# Patient Record
Sex: Male | Born: 1985
Health system: Southern US, Community
[De-identification: ages and names within clinical notes are randomized; demographics above are authoritative.]

## PROBLEM LIST (undated history)

## (undated) DIAGNOSIS — B019 Varicella without complication: Secondary | ICD-10-CM

## (undated) DIAGNOSIS — T7840XA Allergy, unspecified, initial encounter: Secondary | ICD-10-CM

## (undated) DIAGNOSIS — L301 Dyshidrosis [pompholyx]: Secondary | ICD-10-CM

## (undated) DIAGNOSIS — M549 Dorsalgia, unspecified: Secondary | ICD-10-CM

## (undated) DIAGNOSIS — F419 Anxiety disorder, unspecified: Secondary | ICD-10-CM

## (undated) DIAGNOSIS — E0590 Thyrotoxicosis, unspecified without thyrotoxic crisis or storm: Secondary | ICD-10-CM

## (undated) HISTORY — DX: Dyshidrosis (pompholyx): L30.1

## (undated) HISTORY — DX: Allergy, unspecified, initial encounter: T78.40XA

## (undated) HISTORY — DX: Thyrotoxicosis, unspecified without thyrotoxic crisis or storm: E05.90

## (undated) HISTORY — DX: Varicella without complication: B01.9

## (undated) HISTORY — PX: NO PAST SURGERIES: SHX2092

## (undated) HISTORY — DX: Anxiety disorder, unspecified: F41.9

## (undated) HISTORY — DX: Dorsalgia, unspecified: M54.9

---
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
## 2005-01-01 ENCOUNTER — Emergency Department: Payer: Self-pay | Admitting: Emergency Medicine

## 2005-04-13 ENCOUNTER — Emergency Department: Payer: Self-pay | Admitting: Emergency Medicine

## 2005-05-14 ENCOUNTER — Emergency Department: Payer: Self-pay | Admitting: Emergency Medicine

## 2005-08-17 ENCOUNTER — Emergency Department: Payer: Self-pay | Admitting: Emergency Medicine

## 2005-10-10 ENCOUNTER — Emergency Department: Payer: Self-pay | Admitting: Emergency Medicine

## 2005-10-11 ENCOUNTER — Emergency Department: Payer: Self-pay | Admitting: Emergency Medicine

## 2006-03-07 ENCOUNTER — Emergency Department: Payer: Self-pay | Admitting: Emergency Medicine

## 2007-07-05 ENCOUNTER — Emergency Department: Payer: Self-pay | Admitting: Internal Medicine

## 2007-07-05 ENCOUNTER — Other Ambulatory Visit: Payer: Self-pay

## 2007-08-30 ENCOUNTER — Emergency Department: Payer: Self-pay | Admitting: Emergency Medicine

## 2008-03-23 ENCOUNTER — Emergency Department: Payer: Self-pay | Admitting: Emergency Medicine

## 2008-04-02 ENCOUNTER — Emergency Department: Payer: Self-pay | Admitting: Emergency Medicine

## 2008-08-18 ENCOUNTER — Emergency Department: Payer: Self-pay | Admitting: Internal Medicine

## 2008-08-26 ENCOUNTER — Emergency Department: Payer: Self-pay | Admitting: Emergency Medicine

## 2009-01-04 ENCOUNTER — Emergency Department: Payer: Self-pay | Admitting: Emergency Medicine

## 2009-01-29 ENCOUNTER — Emergency Department: Payer: Self-pay | Admitting: Emergency Medicine

## 2009-08-02 ENCOUNTER — Emergency Department: Payer: Self-pay | Admitting: Emergency Medicine

## 2010-09-09 ENCOUNTER — Emergency Department: Payer: Self-pay | Admitting: Emergency Medicine

## 2013-02-24 ENCOUNTER — Emergency Department: Payer: Self-pay | Admitting: Emergency Medicine

## 2014-06-09 ENCOUNTER — Emergency Department: Payer: Self-pay | Admitting: Emergency Medicine

## 2014-06-11 LAB — BETA STREP CULTURE(ARMC)

## 2014-06-12 ENCOUNTER — Emergency Department: Payer: Self-pay | Admitting: Emergency Medicine

## 2014-06-14 LAB — BETA STREP CULTURE(ARMC)

## 2014-06-26 ENCOUNTER — Emergency Department: Payer: Self-pay | Admitting: Emergency Medicine

## 2014-06-26 LAB — URINALYSIS, COMPLETE
BACTERIA: NONE SEEN
Bilirubin,UR: NEGATIVE
Blood: NEGATIVE
Glucose,UR: NEGATIVE mg/dL (ref 0–75)
Ketone: NEGATIVE
LEUKOCYTE ESTERASE: NEGATIVE
Nitrite: NEGATIVE
Ph: 6 (ref 4.5–8.0)
Protein: NEGATIVE
Specific Gravity: 1.005 (ref 1.003–1.030)
WBC UR: NONE SEEN /HPF (ref 0–5)

## 2014-06-26 LAB — COMPREHENSIVE METABOLIC PANEL
ANION GAP: 5 — AB (ref 7–16)
AST: 27 U/L (ref 15–37)
Albumin: 4.1 g/dL (ref 3.4–5.0)
Alkaline Phosphatase: 71 U/L
BUN: 9 mg/dL (ref 7–18)
Bilirubin,Total: 0.6 mg/dL (ref 0.2–1.0)
CHLORIDE: 102 mmol/L (ref 98–107)
CO2: 27 mmol/L (ref 21–32)
Calcium, Total: 8.9 mg/dL (ref 8.5–10.1)
Creatinine: 1 mg/dL (ref 0.60–1.30)
EGFR (African American): >60
EGFR (Non-African Amer.): >60
Glucose: 93 mg/dL (ref 65–99)
Osmolality: 267 (ref 275–301)
Potassium: 4.6 mmol/L (ref 3.5–5.1)
SGPT (ALT): 23 U/L (ref 12–78)
Sodium: 134 mmol/L — ABNORMAL LOW (ref 136–145)
Total Protein: 8.3 g/dL — ABNORMAL HIGH (ref 6.4–8.2)

## 2014-06-26 LAB — CBC
HCT: 41.4 % (ref 40.0–52.0)
HGB: 14.4 g/dL (ref 13.0–18.0)
MCH: 34.1 pg — ABNORMAL HIGH (ref 26.0–34.0)
MCHC: 34.9 g/dL (ref 32.0–36.0)
MCV: 98 fL (ref 80–100)
PLATELETS: 163 10*3/uL (ref 150–440)
RBC: 4.23 10*6/uL — ABNORMAL LOW (ref 4.40–5.90)
RDW: 12.5 % (ref 11.5–14.5)
WBC: 11 10*3/uL — ABNORMAL HIGH (ref 3.8–10.6)

## 2014-11-24 ENCOUNTER — Emergency Department: Payer: Self-pay | Admitting: Internal Medicine

## 2014-11-24 LAB — URINALYSIS, COMPLETE
Bacteria: NONE SEEN
Bilirubin,UR: NEGATIVE
Blood: NEGATIVE
Glucose,UR: NEGATIVE mg/dL (ref 0–75)
Leukocyte Esterase: NEGATIVE
Nitrite: NEGATIVE
Ph: 5 (ref 4.5–8.0)
Protein: 30
RBC,UR: <1 /HPF (ref 0–5)
Specific Gravity: 1.032 (ref 1.003–1.030)
Squamous Epithelial: 1
WBC UR: 1 /HPF (ref 0–5)

## 2014-11-24 LAB — CBC WITH DIFFERENTIAL/PLATELET
BASOS PCT: 1.5 %
Basophil #: 0.1 10*3/uL (ref 0.0–0.1)
EOS ABS: 0.1 10*3/uL (ref 0.0–0.7)
EOS PCT: 0.8 %
HCT: 45.8 % (ref 40.0–52.0)
HGB: 15.6 g/dL (ref 13.0–18.0)
Lymphocyte #: 0.5 10*3/uL — ABNORMAL LOW (ref 1.0–3.6)
Lymphocyte %: 7.7 %
MCH: 33.5 pg (ref 26.0–34.0)
MCHC: 34.1 g/dL (ref 32.0–36.0)
MCV: 98 fL (ref 80–100)
MONO ABS: 0.5 x10 3/mm (ref 0.2–1.0)
Monocyte %: 8.4 %
Neutrophil #: 5 10*3/uL (ref 1.4–6.5)
Neutrophil %: 81.6 %
Platelet: 164 10*3/uL (ref 150–440)
RBC: 4.66 10*6/uL (ref 4.40–5.90)
RDW: 12.6 % (ref 11.5–14.5)
WBC: 6.1 10*3/uL (ref 3.8–10.6)

## 2014-11-24 LAB — COMPREHENSIVE METABOLIC PANEL
ALBUMIN: 3.8 g/dL (ref 3.4–5.0)
Alkaline Phosphatase: 79 U/L
Anion Gap: 9 (ref 7–16)
BUN: 15 mg/dL (ref 7–18)
Bilirubin,Total: 0.6 mg/dL (ref 0.2–1.0)
CALCIUM: 8.4 mg/dL — AB (ref 8.5–10.1)
CO2: 25 mmol/L (ref 21–32)
CREATININE: 0.78 mg/dL (ref 0.60–1.30)
Chloride: 104 mmol/L (ref 98–107)
EGFR (African American): >60
EGFR (Non-African Amer.): >60
Glucose: 94 mg/dL (ref 65–99)
Osmolality: 276 (ref 275–301)
POTASSIUM: 3.6 mmol/L (ref 3.5–5.1)
SGOT(AST): 32 U/L (ref 15–37)
SGPT (ALT): 27 U/L
Sodium: 138 mmol/L (ref 136–145)
Total Protein: 7.8 g/dL (ref 6.4–8.2)

## 2015-04-16 ENCOUNTER — Emergency Department
Admission: EM | Admit: 2015-04-16 | Discharge: 2015-04-16 | Disposition: A | Discharge status: Home / Self Care | Payer: BLUE CROSS/BLUE SHIELD | Attending: Emergency Medicine | Admitting: Emergency Medicine

## 2015-04-16 DIAGNOSIS — N342 Other urethritis: Secondary | ICD-10-CM | POA: Insufficient documentation

## 2015-04-16 DIAGNOSIS — R300 Dysuria: Secondary | ICD-10-CM | POA: Diagnosis present

## 2015-04-16 MED ORDER — CEFTRIAXONE SODIUM 250 MG IJ SOLR
250.0000 mg | INTRAMUSCULAR | Status: DC
  Administered 2015-04-16: 250 mg via INTRAMUSCULAR

## 2015-04-16 MED ORDER — CEFTRIAXONE SODIUM 250 MG IJ SOLR
INTRAMUSCULAR | Status: AC
  Administered 2015-04-16: 250 mg via INTRAMUSCULAR
  Filled 2015-04-16: qty 250

## 2015-04-16 MED ORDER — AZITHROMYCIN 250 MG PO TABS
ORAL_TABLET | ORAL | Status: AC
  Administered 2015-04-16: 1000 mg via ORAL
  Filled 2015-04-16: qty 4

## 2015-04-16 MED ORDER — AZITHROMYCIN 250 MG PO TABS
1000.0000 mg | ORAL_TABLET | Freq: Once | ORAL | Status: AC
  Administered 2015-04-16: 1000 mg via ORAL

## 2015-04-16 NOTE — Discharge Instructions (Signed)
Urethritis °Urethritis is an inflammation of the tube through which urine exits your bladder (urethra).  °CAUSES °Urethritis is often caused by an infection in your urethra. The infection can be viral, like herpes. The infection can also be bacterial, like gonorrhea. °RISK FACTORS °Risk factors of urethritis include: °· Having sex without using a condom. °· Having multiple sexual partners. °· Having poor hygiene. °SIGNS AND SYMPTOMS °Symptoms of urethritis are less noticeable in women than in men. These symptoms include: °· Burning feeling when you urinate (dysuria). °· Discharge from your urethra. °· Blood in your urine (hematuria). °· Urinating more than usual. °DIAGNOSIS  °To confirm a diagnosis of urethritis, your health care provider will do the following: °· Ask about your sexual history. °· Perform a physical exam. °· Have you provide a sample of your urine for lab testing. °· Use a cotton swab to gently collect a sample from your urethra for lab testing. °TREATMENT  °It is important to treat urethritis. Depending on the cause, untreated urethritis may lead to serious genital infections and possibly infertility. Urethritis caused by a bacterial infection is treated with antibiotic medicine. All sexual partners must be treated.  °HOME CARE INSTRUCTIONS °· Do not have sex until the test results are known and treatment is completed, even if your symptoms go away before you finish treatment. °· If you were prescribed an antibiotic, finish it all even if you start to feel better. °SEEK MEDICAL CARE IF:  °· Your symptoms are not improved in 3 days. °· Your symptoms are getting worse. °· You develop abdominal pain or pelvic pain (in women). °· You develop joint pain. °· You have a fever. °SEEK IMMEDIATE MEDICAL CARE IF:  °· You have severe pain in the belly, back, or side. °· You have repeated vomiting. °MAKE SURE YOU: °· Understand these instructions. °· Will watch your condition. °· Will get help right away if you  are not doing well or get worse. °Document Released: 05/26/2001 Document Revised: 04/16/2014 Document Reviewed: 07/31/2013 °Exit[employer]® Patient Information ©2015 [employer]. This information is not intended to replace advice given to you by your health care provider. Make sure you discuss any questions you have with your health care provider. ° °

## 2015-04-16 NOTE — ED Notes (Signed)
Pt to ED c/o burning with urination starting last night.  Pt states "off color" discharge as well.  Denies hx of UTI, urgency or frequency.

## 2015-04-16 NOTE — ED Notes (Signed)
Pt states he has been having a burning at the tip of his penis since 0300 this am. Pt also endorses a yellow discharge from his penis. Pt claims he is married and only has sex with his wife.

## 2015-04-16 NOTE — ED Provider Notes (Addendum)
[facility] Emergency Department Provider Note   Time seen: 9:48 PM  I have reviewed the triage vital signs and the nursing notes.   HISTORY  Chief Complaint Dysuria    HPI Phillip Howell is a 29 y.o. male percents for dysuria and burning at his penis and so 300 this a.m. Patient has a yellow discharge from his penis. States she is sexually active last history of same burning is mild again worsening with urination.    No past medical history on file.  There are no active problems to display for this patient.   No past surgical history on file.  No current outpatient prescriptions on file.  Allergies Review of patient's allergies indicates no known allergies.  No family history on file.  Social History History  Substance Use Topics  . Smoking status: Not on file  . Smokeless tobacco: Not on file  . Alcohol Use: Not on file    Review of Systems Constitutional: Negative for fever. Eyes: Negative for visual changes. ENT: Negative for sore throat. Cardiovascular: Negative for chest pain. Respiratory: Negative for shortness of breath. Gastrointestinal: Negative for abdominal pain, vomiting and diarrhea. Genitourinary: Negative for dysuria. Musculoskeletal: Negative for back pain. Skin: Negative for rash. Neurological: Negative for headaches, focal weakness or numbness.  10-point ROS otherwise negative.  ____________________________________________   PHYSICAL EXAM:  VITAL SIGNS: ED Triage Vitals  Enc Vitals Group     BP 04/16/15 2123 114/67 mmHg     Pulse Rate 04/16/15 2123 73     Resp 04/16/15 2123 16     Temp 04/16/15 2123 98.5 F (36.9 C)     Temp Source 04/16/15 2123 Oral     SpO2 04/16/15 2123 99 %     Weight 04/16/15 2123 200 lb (90.719 kg)     Height 04/16/15 2123 5\' 11"  (1.803 m)     Head Cir --      Peak Flow --      Pain Score 04/16/15 2124 0     Pain Loc --      Pain Edu? --      Excl. in GC? --      Constitutional: Alert and oriented. Well appearing and in no distress.    Mouth/Throat: Mucous membranes are moist. Genitourinary: Discharge noted from penis ____________________________________________    LABS (pertinent positives/negatives)  None necessary  ____________________________________________        IMPRESSION / ASSESSMENT AND PLAN / ED COURSE  Pertinent labs & imaging results that were available during my care of the patient were reviewed by me and considered in my medical decision making (see chart for details).  Urethritis  Patient clinically with either gonorrhea or chlamydia. Will present really treat with Rocephin and azithromycin. Patient is advised to let his sexual partners now. Stable for discharge.   Phillip Howell, Phillip Oconnor E, MD   Phillip FilbertJonathan Howell Joyclyn Plazola, MD 04/16/15 [phone]  Phillip FilbertJonathan Howell Emrie Gayle, MD 05/02/15 [phone]

## 2015-04-16 NOTE — ED Notes (Signed)
Pt on med hold. 

## 2015-07-31 ENCOUNTER — Emergency Department: Payer: BLUE CROSS/BLUE SHIELD

## 2015-07-31 ENCOUNTER — Emergency Department
Admission: EM | Admit: 2015-07-31 | Discharge: 2015-07-31 | Disposition: A | Discharge status: Home / Self Care | Payer: BLUE CROSS/BLUE SHIELD | Attending: Emergency Medicine | Admitting: Emergency Medicine

## 2015-07-31 ENCOUNTER — Encounter: Payer: Self-pay | Admitting: Urgent Care

## 2015-07-31 DIAGNOSIS — S199XXA Unspecified injury of neck, initial encounter: Secondary | ICD-10-CM | POA: Diagnosis present

## 2015-07-31 DIAGNOSIS — Y92410 Unspecified street and highway as the place of occurrence of the external cause: Secondary | ICD-10-CM | POA: Diagnosis not present

## 2015-07-31 DIAGNOSIS — Y9389 Activity, other specified: Secondary | ICD-10-CM | POA: Insufficient documentation

## 2015-07-31 DIAGNOSIS — M542 Cervicalgia: Secondary | ICD-10-CM

## 2015-07-31 DIAGNOSIS — M7918 Myalgia, other site: Secondary | ICD-10-CM

## 2015-07-31 DIAGNOSIS — Y998 Other external cause status: Secondary | ICD-10-CM | POA: Diagnosis not present

## 2015-07-31 MED ORDER — CYCLOBENZAPRINE HCL 10 MG PO TABS
5.0000 mg | ORAL_TABLET | Freq: Once | ORAL | Status: AC
  Administered 2015-07-31: 5 mg via ORAL
  Filled 2015-07-31: qty 1

## 2015-07-31 MED ORDER — TRAMADOL HCL 50 MG PO TABS: 50.0000 mg | ORAL_TABLET | Freq: Four times a day (QID) | ORAL | Status: DC | PRN

## 2015-07-31 MED ORDER — DIAZEPAM 2 MG PO TABS: 2.0000 mg | ORAL_TABLET | Freq: Three times a day (TID) | ORAL | Status: DC | PRN
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
## 2015-07-31 NOTE — Discharge Instructions (Signed)
Cervical Sprain °A cervical sprain is an injury in the neck in which the strong, fibrous tissues (ligaments) that connect your neck bones stretch or tear. Cervical sprains can range from mild to severe. Severe cervical sprains can cause the neck vertebrae to be unstable. This can lead to damage of the spinal cord and can result in serious nervous system problems. The amount of time it takes for a cervical sprain to get better depends on the cause and extent of the injury. Most cervical sprains heal in 1 to 3 weeks. °CAUSES  °Severe cervical sprains may be caused by:  °· Contact sport injuries (such as from football, rugby, wrestling, hockey, auto racing, gymnastics, diving, martial arts, or boxing).   °· Motor vehicle collisions.   °· Whiplash injuries. This is an injury from a sudden forward and backward whipping movement of the head and neck.  °· Falls.   °Mild cervical sprains may be caused by:  °· Being in an awkward position, such as while cradling a telephone between your ear and shoulder.   °· Sitting in a chair that does not offer proper support.   °· Working at a poorly designed computer station.   °· Looking up or down for long periods of time.   °SYMPTOMS  °· Pain, soreness, stiffness, or a burning sensation in the front, back, or sides of the neck. This discomfort may develop immediately after the injury or slowly, 24 hours or more after the injury.   °· Pain or tenderness directly in the middle of the back of the neck.   °· Shoulder or upper back pain.   °· Limited ability to move the neck.   °· Headache.   °· Dizziness.   °· Weakness, numbness, or tingling in the hands or arms.   °· Muscle spasms.   °· Difficulty swallowing or chewing.   °· Tenderness and swelling of the neck.   °DIAGNOSIS  °Most of the time your health care provider can diagnose a cervical sprain by taking your history and doing a physical exam. Your health care provider will ask about previous neck injuries and any known neck  problems, such as arthritis in the neck. X-rays may be taken to find out if there are any other problems, such as with the bones of the neck. Other tests, such as a CT scan or MRI, may also be needed.  °TREATMENT  °Treatment depends on the severity of the cervical sprain. Mild sprains can be treated with rest, keeping the neck in place (immobilization), and pain medicines. Severe cervical sprains are immediately immobilized. Further treatment is done to help with pain, muscle spasms, and other symptoms and may include: °· Medicines, such as pain relievers, numbing medicines, or muscle relaxants.   °· Physical therapy. This may involve stretching exercises, strengthening exercises, and posture training. Exercises and improved posture can help stabilize the neck, strengthen muscles, and help stop symptoms from returning.   °HOME CARE INSTRUCTIONS  °· Put ice on the injured area.   °¨ Put ice in a plastic bag.   °¨ Place a towel between your skin and the bag.   °¨ Leave the ice on for 15-20 minutes, 3-4 times a day.   °· If your injury was severe, you may have been given a cervical collar to wear. A cervical collar is a two-piece collar designed to keep your neck from moving while it heals. °¨ Do not remove the collar unless instructed by your health care provider. °¨ If you have long hair, keep it outside of the collar. °¨ Ask your health care provider before making any adjustments to your collar. Minor   adjustments may be required over time to improve comfort and reduce pressure on your chin or on the back of your head. °¨ If you are allowed to remove the collar for cleaning or bathing, follow your health care provider's instructions on how to do so safely. °¨ Keep your collar clean by wiping it with mild soap and water and drying it completely. If the collar you have been given includes removable pads, remove them every 1-2 days and hand wash them with soap and water. Allow them to air dry. They should be completely  dry before you wear them in the collar. °¨ If you are allowed to remove the collar for cleaning and bathing, wash and dry the skin of your neck. Check your skin for irritation or sores. If you see any, tell your health care provider. °¨ Do not drive while wearing the collar.   °· Only take over-the-counter or prescription medicines for pain, discomfort, or fever as directed by your health care provider.   °· Keep all follow-up appointments as directed by your health care provider.   °· Keep all physical therapy appointments as directed by your health care provider.   °· Make any needed adjustments to your workstation to promote good posture.   °· Avoid positions and activities that make your symptoms worse.   °· Warm up and stretch before being active to help prevent problems.   °SEEK MEDICAL CARE IF:  °· Your pain is not controlled with medicine.   °· You are unable to decrease your pain medicine over time as planned.   °· Your activity level is not improving as expected.   °SEEK IMMEDIATE MEDICAL CARE IF:  °· You develop any bleeding. °· You develop stomach upset. °· You have signs of an allergic reaction to your medicine.   °· Your symptoms get worse.   °· You develop new, unexplained symptoms.   °· You have numbness, tingling, weakness, or paralysis in any part of your body.   °MAKE SURE YOU:  °· Understand these instructions. °· Will watch your condition. °· Will get help right away if you are not doing well or get worse. °Document Released: 09/27/2007 Document Revised: 12/05/2013 Document Reviewed: 06/07/2013 °[employer]® Patient Information ©2015 [employer]. This information is not intended to replace advice given to you by your health care provider. Make sure you discuss any questions you have with your health care provider. °Musculoskeletal Pain °Musculoskeletal pain is muscle and boney aches and pains. These pains can occur in any part of the body. Your caregiver may treat you without knowing the cause of  the pain. They may treat you if blood or urine tests, X-rays, and other tests were normal.  °CAUSES °There is often not a definite cause or reason for these pains. These pains may be caused by a type of germ (virus). The discomfort may also come from overuse. Overuse includes working out too hard when your body is not fit. Boney aches also come from weather changes. Bone is sensitive to atmospheric pressure changes. °HOME CARE INSTRUCTIONS  °· Ask when your test results will be ready. Make sure you get your test results. °· Only take over-the-counter or prescription medicines for pain, discomfort, or fever as directed by your caregiver. If you were given medications for your condition, do not drive, operate machinery or power tools, or sign legal documents for 24 hours. Do not drink alcohol. Do not take sleeping pills or other medications that may interfere with treatment. °· Continue all activities unless the activities cause more pain. When the pain lessens, slowly resume   normal activities. Gradually increase the intensity and duration of the activities or exercise. °· During periods of severe pain, bed rest may be helpful. Lay or sit in any position that is comfortable. °· Putting ice on the injured area. °¨ Put ice in a bag. °¨ Place a towel between your skin and the bag. °¨ Leave the ice on for 15 to 20 minutes, 3 to 4 times a day. °· Follow up with your caregiver for continued problems and no reason can be found for the pain. If the pain becomes worse or does not go away, it may be necessary to repeat tests or do additional testing. Your caregiver may need to look further for a possible cause. °SEEK IMMEDIATE MEDICAL CARE IF: °· You have pain that is getting worse and is not relieved by medications. °· You develop chest pain that is associated with shortness or breath, sweating, feeling sick to your stomach (nauseous), or throw up (vomit). °· Your pain becomes localized to the abdomen. °· You develop any new  symptoms that seem different or that concern you. °MAKE SURE YOU:  °· Understand these instructions. °· Will watch your condition. °· Will get help right away if you are not doing well or get worse. °Document Released: 11/30/2005 Document Revised: 02/22/2012 Document Reviewed: 08/04/2013 °[employer]® Patient Information ©2015 [employer]. This information is not intended to replace advice given to you by your health care provider. Make sure you discuss any questions you have with your health care provider. ° °

## 2015-07-31 NOTE — ED Notes (Signed)
Webster,MD consulted. MD made aware of presenting complaints and triage assessment. MD with [facility] for plain films of patient's cervical spine. Orders to be entered and carried by this RN.

## 2015-09-10 ENCOUNTER — Encounter: Payer: Self-pay | Admitting: Emergency Medicine

## 2015-09-10 ENCOUNTER — Emergency Department
Admission: EM | Admit: 2015-09-10 | Discharge: 2015-09-10 | Disposition: A | Discharge status: Home / Self Care | Payer: BLUE CROSS/BLUE SHIELD | Attending: Emergency Medicine | Admitting: Emergency Medicine

## 2015-09-10 ENCOUNTER — Emergency Department: Payer: BLUE CROSS/BLUE SHIELD

## 2015-09-10 DIAGNOSIS — S40811A Abrasion of right upper arm, initial encounter: Secondary | ICD-10-CM | POA: Diagnosis not present

## 2015-09-10 DIAGNOSIS — S60811A Abrasion of right wrist, initial encounter: Secondary | ICD-10-CM | POA: Diagnosis not present

## 2015-09-10 DIAGNOSIS — Y92410 Unspecified street and highway as the place of occurrence of the external cause: Secondary | ICD-10-CM | POA: Diagnosis not present

## 2015-09-10 DIAGNOSIS — Y9389 Activity, other specified: Secondary | ICD-10-CM | POA: Insufficient documentation

## 2015-09-10 DIAGNOSIS — S60511A Abrasion of right hand, initial encounter: Secondary | ICD-10-CM | POA: Insufficient documentation

## 2015-09-10 DIAGNOSIS — T07XXXA Unspecified multiple injuries, initial encounter: Secondary | ICD-10-CM

## 2015-09-10 DIAGNOSIS — Y998 Other external cause status: Secondary | ICD-10-CM | POA: Insufficient documentation

## 2015-09-10 DIAGNOSIS — S6991XA Unspecified injury of right wrist, hand and finger(s), initial encounter: Secondary | ICD-10-CM | POA: Diagnosis present

## 2015-09-10 MED ORDER — IBUPROFEN 800 MG PO TABS: 800.0000 mg | ORAL_TABLET | Freq: Three times a day (TID) | ORAL | Status: DC | PRN

## 2015-09-10 MED ORDER — BACITRACIN ZINC 500 UNIT/GM EX OINT: TOPICAL_OINTMENT | CUTANEOUS | Status: AC

## 2015-09-10 MED ORDER — TETANUS-DIPHTH-ACELL PERTUSSIS 5-2.5-18.5 LF-MCG/0.5 IM SUSP
0.5000 mL | Freq: Once | INTRAMUSCULAR | Status: AC
  Administered 2015-09-10: 0.5 mL via INTRAMUSCULAR
  Filled 2015-09-10: qty 0.5

## 2015-09-10 NOTE — Discharge Instructions (Signed)
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
## 2016-03-05 ENCOUNTER — Emergency Department
Admission: EM | Admit: 2016-03-05 | Discharge: 2016-03-06 | Disposition: A | Discharge status: Home / Self Care | Payer: BLUE CROSS/BLUE SHIELD | Attending: Student | Admitting: Student

## 2016-03-05 DIAGNOSIS — Z792 Long term (current) use of antibiotics: Secondary | ICD-10-CM | POA: Diagnosis not present

## 2016-03-05 DIAGNOSIS — H5712 Ocular pain, left eye: Secondary | ICD-10-CM | POA: Diagnosis present

## 2016-03-05 DIAGNOSIS — H109 Unspecified conjunctivitis: Secondary | ICD-10-CM | POA: Insufficient documentation

## 2016-03-05 NOTE — ED Notes (Signed)
Pt in with co left eye redness and itching since this am, no injury.

## 2016-03-06 MED ORDER — ERYTHROMYCIN 5 MG/GM OP OINT: 1.0000 "application " | TOPICAL_OINTMENT | Freq: Four times a day (QID) | OPHTHALMIC | Status: DC

## 2016-03-06 MED ORDER — FLUORESCEIN SODIUM 1 MG OP STRP
1.0000 | ORAL_STRIP | Freq: Once | OPHTHALMIC | Status: AC
  Administered 2016-03-06: 1 via OPHTHALMIC

## 2016-03-06 MED ORDER — FLUORESCEIN SODIUM 1 MG OP STRP
ORAL_STRIP | OPHTHALMIC | Status: AC
  Administered 2016-03-06: 1 via OPHTHALMIC
  Filled 2016-03-06: qty 1

## 2016-03-06 NOTE — ED Provider Notes (Signed)
[facility] Emergency Department Provider Note  ____________________________________________  Time seen: Approximately 12:11 AM  I have reviewed the triage vital signs and the nursing notes.   HISTORY  Chief Complaint Eye Pain    HPI Phillip [place] is a 30 y.o. male with no chronic medical problems who presents for evaluation of left eye itching and redness this afternoon, gradual onset, constant since onset, currently moderate, no modifying factors. Patient reports that he awoke from a nap this afternoon and his left eye was red and itchy. He denies any pain or vision change. He denies any trauma to the eye or possibility of foreign body. He reports that there is some mild drainage. No coughing, sneezing, runny nose, congestion, vomiting, diarrhea, fevers or chills. No chest pain or difficulty breathing.   No past medical history on file.  There are no active problems to display for this patient.   No past surgical history on file.  Current Outpatient Rx  Name  Route  Sig  Dispense  Refill  . bacitracin ointment      Apply to affected area daily   30 g   0   . diazepam (VALIUM) 2 MG tablet   Oral   Take 1 tablet (2 mg total) by mouth every 8 (eight) hours as needed for muscle spasms.   12 tablet   0   . ibuprofen (ADVIL,MOTRIN) 800 MG tablet   Oral   Take 1 tablet (800 mg total) by mouth every 8 (eight) hours as needed.   30 tablet   0   . traMADol (ULTRAM) 50 MG tablet   Oral   Take 1 tablet (50 mg total) by mouth every 6 (six) hours as needed.   12 tablet   0     Allergies Review of patient's allergies indicates no known allergies.  No family history on file.  Social History Social History  Substance Use Topics  . Smoking status: Never Smoker   . Smokeless tobacco: Not on file  . Alcohol Use: No    Review of Systems Constitutional: No fever/chills Eyes: No visual changes. ENT: No sore throat. Cardiovascular: Denies chest  pain. Respiratory: Denies shortness of breath. Gastrointestinal: No abdominal pain.  No nausea, no vomiting.  No diarrhea.  No constipation. Genitourinary: Negative for dysuria. Musculoskeletal: Negative for back pain. Skin: Negative for rash. Neurological: Negative for headaches, focal weakness or numbness.  10-point ROS otherwise negative.  ____________________________________________   PHYSICAL EXAM:  VITAL SIGNS: ED Triage Vitals  Enc Vitals Group     BP 03/05/16 2338 112/68 mmHg     Pulse Rate 03/05/16 2338 59     Resp 03/05/16 2338 18     Temp 03/05/16 2338 98.2 F (36.8 C)     Temp Source 03/05/16 2338 Oral     SpO2 03/05/16 2338 100 %     Weight 03/05/16 2338 185 lb (83.915 kg)     Height 03/05/16 2338 5\' 11"  (1.803 m)     Head Cir --      Peak Flow --      Pain Score 03/05/16 2339 6     Pain Loc --      Pain Edu? --      Excl. in GC? --     Constitutional: Alert and oriented. Well appearing and in no acute distress. Eyes: Very faint conjunctival erythema of the left eye. Normal conjunctiva on the right. No added drainage. PERRL. EOMI. Woods lamp exam with fluorescein stain  reveals no corneal abrasion. Head: Atraumatic. Nose: No congestion/rhinnorhea. Mouth/Throat: Mucous membranes are moist.  Oropharynx non-erythematous. Neck: No stridor. Supple without meningismus. Cardiovascular: Normal rate, regular rhythm. Grossly normal heart sounds.  Good peripheral circulation. Respiratory: Normal respiratory effort.  No retractions. Lungs CTAB. Gastrointestinal: Soft and nontender. No distention. No CVA tenderness. Genitourinary: deferred Musculoskeletal: No lower extremity tenderness nor edema.  No joint effusions. Neurologic:  Normal speech and language. No gross focal neurologic deficits are appreciated. No gait instability. Skin:  Skin is warm, dry and intact. No rash noted. Psychiatric: Mood and affect are normal. Speech and behavior are  normal.  ____________________________________________   LABS (all labs ordered are listed, but only abnormal results are displayed)  Labs Reviewed - No data to display ____________________________________________  EKG  none ____________________________________________  RADIOLOGY  none ____________________________________________   PROCEDURES  Procedure(s) performed: None  Critical Care performed: No  ____________________________________________   INITIAL IMPRESSION / ASSESSMENT AND PLAN / ED COURSE  Pertinent labs & imaging results that were available during my care of the patient were reviewed by me and considered in my medical decision making (see chart for details).  Phillip Howell is a 30 y.o. male with no chronic medical problems who presents for evaluation of left eye itching and redness this afternoon without trauma. No vision change. On exam, he is well-appearing and in no acute distress. Vital signs stable, he is afebrile. He has the faintest conjunctival erythema associated with the left eye but exam is otherwise unremarkable. Discussed that this may represent early viral versus bacterial conjunctivitis. We'll treat with erythromycin ointment. We discussed return precautions, need for close PCP follow-up and he comfortable with the discharge plan. DC home. ____________________________________________   FINAL CLINICAL IMPRESSION(S) / ED DIAGNOSES  Final diagnoses:  Conjunctivitis of left eye      Gayla Doss, MD 03/06/16 [phone]

## 2016-05-25 ENCOUNTER — Emergency Department
Admission: EM | Admit: 2016-05-25 | Discharge: 2016-05-25 | Disposition: A | Discharge status: Home / Self Care | Payer: BLUE CROSS/BLUE SHIELD | Attending: Emergency Medicine | Admitting: Emergency Medicine

## 2016-05-25 DIAGNOSIS — M545 Low back pain: Secondary | ICD-10-CM | POA: Insufficient documentation

## 2016-05-25 DIAGNOSIS — R1033 Periumbilical pain: Secondary | ICD-10-CM | POA: Diagnosis present

## 2016-05-25 LAB — COMPREHENSIVE METABOLIC PANEL
ALBUMIN: 4.1 g/dL (ref 3.5–5.0)
ALT: 20 U/L (ref 17–63)
AST: 31 U/L (ref 15–41)
Alkaline Phosphatase: 53 U/L (ref 38–126)
Anion gap: 7 (ref 5–15)
BUN: 11 mg/dL (ref 6–20)
CHLORIDE: 104 mmol/L (ref 101–111)
CO2: 27 mmol/L (ref 22–32)
Calcium: 9.3 mg/dL (ref 8.9–10.3)
Creatinine, Ser: 0.88 mg/dL (ref 0.61–1.24)
GFR calc Af Amer: >60 mL/min (ref >60)
GLUCOSE: 98 mg/dL (ref 65–99)
POTASSIUM: 3.5 mmol/L (ref 3.5–5.1)
Sodium: 138 mmol/L (ref 135–145)
Total Bilirubin: 1.2 mg/dL (ref 0.3–1.2)
Total Protein: 6.6 g/dL (ref 6.5–8.1)

## 2016-05-25 LAB — CBC
HEMATOCRIT: 40.9 % (ref 40.0–52.0)
Hemoglobin: 14.3 g/dL (ref 13.0–18.0)
MCH: 33.6 pg (ref 26.0–34.0)
MCHC: 34.9 g/dL (ref 32.0–36.0)
MCV: 96.2 fL (ref 80.0–100.0)
Platelets: 147 10*3/uL — ABNORMAL LOW (ref 150–440)
RBC: 4.25 MIL/uL — ABNORMAL LOW (ref 4.40–5.90)
RDW: 12.4 % (ref 11.5–14.5)
WBC: 5.3 10*3/uL (ref 3.8–10.6)

## 2016-05-25 LAB — URINALYSIS COMPLETE WITH MICROSCOPIC (ARMC ONLY)
Bilirubin Urine: NEGATIVE
Glucose, UA: NEGATIVE mg/dL
Hgb urine dipstick: NEGATIVE
KETONES UR: NEGATIVE mg/dL
Leukocytes, UA: NEGATIVE
Nitrite: NEGATIVE
PH: 7 (ref 5.0–8.0)
PROTEIN: 30 mg/dL — AB
Specific Gravity, Urine: 1.027 (ref 1.005–1.030)

## 2016-05-25 LAB — LIPASE, BLOOD: LIPASE: 18 U/L (ref 11–51)

## 2016-05-25 MED ORDER — TRAMADOL HCL 50 MG PO TABS: 50.0000 mg | ORAL_TABLET | Freq: Four times a day (QID) | ORAL | Status: AC | PRN

## 2016-05-25 NOTE — ED Notes (Signed)
Pt c/o umbilicus pain that radiates through to the back since this morning.. Denies N/V/D.Marland Kitchen. States last BM was today and was normal for the pt./

## 2016-05-25 NOTE — ED Provider Notes (Signed)
[facility] Emergency Department Provider Note  Time seen: 8:08 PM  I have reviewed the triage vital signs and the nursing notes.   HISTORY  Chief Complaint Abdominal Pain    HPI Phillip Howell is a 30 y.o. male with no past medical history who presents the emergency department with lower abdominal pain. According to the patient he has been having intermittent lower back pain for the past few months, states the pain comes and goes. He states this morning he was also having pain around his umbilicus. He states it feels like a pressure sensation like someone is pushing on his abdomen. Rates the pain as a 6/10, dull/pressure feeling. Denies any nausea or vomiting. Denies any diarrhea. Denies any dysuria. States he had a normal bowel movement this morning, denies any black or blood. Patient states the pain is somewhat better now but still present.     History reviewed. No pertinent past medical history.  There are no active problems to display for this patient.   History reviewed. No pertinent past surgical history.  Current Outpatient Rx  Name  Route  Sig  Dispense  Refill  . bacitracin ointment      Apply to affected area daily   30 g   0   . diazepam (VALIUM) 2 MG tablet   Oral   Take 1 tablet (2 mg total) by mouth every 8 (eight) hours as needed for muscle spasms.   12 tablet   0   . erythromycin (ROMYCIN) ophthalmic ointment   Left Eye   Place 1 application into the left eye 4 (four) times daily.   3.5 g   0   . ibuprofen (ADVIL,MOTRIN) 800 MG tablet   Oral   Take 1 tablet (800 mg total) by mouth every 8 (eight) hours as needed.   30 tablet   0   . traMADol (ULTRAM) 50 MG tablet   Oral   Take 1 tablet (50 mg total) by mouth every 6 (six) hours as needed.   12 tablet   0     Allergies Review of patient's allergies indicates no known allergies.  No family history on file.  Social History Social History  Substance Use Topics  .  Smoking status: Never Smoker   . Smokeless tobacco: None  . Alcohol Use: No    Review of Systems Constitutional: Negative for fever. Cardiovascular: Negative for chest pain. Respiratory: Negative for shortness of breath. Gastrointestinal: Periumbilical abdominal pain. Genitourinary: Negative for dysuria. Musculoskeletal: Intermittent lower back pain over the past few months. Skin: Negative for rash. Neurological: Negative for headache 10-point ROS otherwise negative.  ____________________________________________   PHYSICAL EXAM:  VITAL SIGNS: ED Triage Vitals  Enc Vitals Group     BP 05/25/16 1845 114/62 mmHg     Pulse Rate 05/25/16 1845 92     Resp 05/25/16 1845 16     Temp 05/25/16 1845 98.8 F (37.1 C)     Temp Source 05/25/16 1845 Oral     SpO2 05/25/16 1845 99 %     Weight 05/25/16 1845 190 lb (86.183 kg)     Height 05/25/16 1845 5\' 11"  (1.803 m)     Head Cir --      Peak Flow --      Pain Score 05/25/16 1846 6     Pain Loc --      Pain Edu? --      Excl. in GC? --     Constitutional: Alert and  oriented. Well appearing and in no distress. Eyes: Normal exam ENT   Head: Normocephalic and atraumatic.   Mouth/Throat: Mucous membranes are moist. Cardiovascular: Normal rate, regular rhythm. No murmur Respiratory: Normal respiratory effort without tachypnea nor retractions. Breath sounds are clear  Gastrointestinal: Soft, mild periumbilical tenderness palpation. No rebound or guarding. No distention. Musculoskeletal: Nontender with normal range of motion in all extremities.  Neurologic:  Normal speech and language. No gross focal neurologic deficits  Skin:  Skin is warm, dry and intact.  Psychiatric: Mood and affect are normal.  ____________________________________________     INITIAL IMPRESSION / ASSESSMENT AND PLAN / ED COURSE  Pertinent labs & imaging results that were available during my care of the patient were reviewed by me and considered in my  medical decision making (see chart for details).  Patient presents with periumbilical abdominal pain. States the pain is actually improved from earlier today. Denies any nausea vomiting or diarrhea. Patient's labs are within normal limits. I discussed with the patient proceeding with a CT of the abdomen/pelvis to further evaluate. Patient discussed this with his wife, states he would rather go home and if the pain returns or worsens come back for the CAT scan. With normal labs and a fairly normal physical examination besides mild periumbilical pain and believe this is a safe course of action. We will discharge with a very short course of Ultram, if the pain worsens patient will return to the hospital for CT scan.  ____________________________________________   FINAL CLINICAL IMPRESSION(S) / ED DIAGNOSES  Abdominal pain   Minna Antis, MD 05/25/16 2030

## 2016-05-25 NOTE — Discharge Instructions (Signed)
As we discussed please return to the emergency department for any worsening abdominal pain, fever, vomiting or any other symptom personally concerning to yourself.   Abdominal Pain, Adult Many things can cause abdominal pain. Usually, abdominal pain is not caused by a disease and will improve without treatment. It can often be observed and treated at home. Your health care provider will do a physical exam and possibly order blood tests and X-rays to help determine the seriousness of your pain. However, in many cases, more time must pass before a clear cause of the pain can be found. Before that point, your health care provider may not know if you need more testing or further treatment. HOME CARE INSTRUCTIONS Monitor your abdominal pain for any changes. The following actions may help to alleviate any discomfort you are experiencing:  Only take over-the-counter or prescription medicines as directed by your health care provider.  Do not take laxatives unless directed to do so by your health care provider.  Try a clear liquid diet (broth, tea, or water) as directed by your health care provider. Slowly move to a bland diet as tolerated. SEEK MEDICAL CARE IF:  You have unexplained abdominal pain.  You have abdominal pain associated with nausea or diarrhea.  You have pain when you urinate or have a bowel movement.  You experience abdominal pain that wakes you in the night.  You have abdominal pain that is worsened or improved by eating food.  You have abdominal pain that is worsened with eating fatty foods.  You have a fever. SEEK IMMEDIATE MEDICAL CARE IF:  Your pain does not go away within 2 hours.  You keep throwing up (vomiting).  Your pain is felt only in portions of the abdomen, such as the right side or the left lower portion of the abdomen.  You pass bloody or black tarry stools. MAKE SURE YOU:  Understand these instructions.  Will watch your condition.  Will get help  right away if you are not doing well or get worse.   This information is not intended to replace advice given to you by your health care provider. Make sure you discuss any questions you have with your health care provider.   Document Released: 09/09/2005 Document Revised: 08/21/2015 Document Reviewed: 08/09/2013 [employer] Interactive Patient Education [employer]2016 [employer]

## 2016-05-28 ENCOUNTER — Encounter: Payer: Self-pay | Admitting: *Deleted

## 2016-05-28 ENCOUNTER — Emergency Department
Admission: EM | Admit: 2016-05-28 | Discharge: 2016-05-28 | Disposition: A | Discharge status: Home / Self Care | Payer: BLUE CROSS/BLUE SHIELD | Attending: Emergency Medicine | Admitting: Emergency Medicine

## 2016-05-28 DIAGNOSIS — M545 Low back pain: Secondary | ICD-10-CM | POA: Diagnosis present

## 2016-05-28 DIAGNOSIS — M62830 Muscle spasm of back: Secondary | ICD-10-CM | POA: Diagnosis not present

## 2016-05-28 MED ORDER — CYCLOBENZAPRINE HCL 10 MG PO TABS: 10.0000 mg | ORAL_TABLET | Freq: Three times a day (TID) | ORAL | Status: DC | PRN

## 2016-05-28 MED ORDER — NAPROXEN 500 MG PO TABS: 500.0000 mg | ORAL_TABLET | Freq: Two times a day (BID) | ORAL | Status: DC

## 2016-05-28 NOTE — ED Provider Notes (Signed)
[facility] Emergency Department Provider Note  ____________________________________________  Time seen: Approximately 9:23 AM  I have reviewed the triage vital signs and the nursing notes.   HISTORY  Chief Complaint Back Pain    HPI Phillip Howell is a 30 y.o. male , NAD, presents to the emergency department with 3 day history of back pain. States he was seen in this emergency department on Monday for abdominal pain and back pain. States that his abdominal pain has resolved but his back pain has moved up in between his shoulders. Has taken ibuprofen over-the-counter with no resolution of symptoms. Denies any injuries, traumas, falls. Has not had any numbness, weakness, tingling of any extremities. Denies any skin sores, rashes. Has not had any neck pain. Denies saddle paresthesias nor loss of bowel or bladder control. Has not had any fevers, chills, body aches.    History reviewed. No pertinent past medical history.  There are no active problems to display for this patient.   History reviewed. No pertinent past surgical history.  Current Outpatient Rx  Name  Route  Sig  Dispense  Refill  . bacitracin ointment      Apply to affected area daily   30 g   0   . cyclobenzaprine (FLEXERIL) 10 MG tablet   Oral   Take 1 tablet (10 mg total) by mouth 3 (three) times daily as needed for muscle spasms.   21 tablet   0   . naproxen (NAPROSYN) 500 MG tablet   Oral   Take 1 tablet (500 mg total) by mouth 2 (two) times daily with a meal.   14 tablet   0   . traMADol (ULTRAM) 50 MG tablet   Oral   Take 1 tablet (50 mg total) by mouth every 6 (six) hours as needed.   20 tablet   0     Allergies Review of patient's allergies indicates no known allergies.  History reviewed. No pertinent family history.  Social History Social History  Substance Use Topics  . Smoking status: Never Smoker   . Smokeless tobacco: None  . Alcohol Use: No     Review of  Systems  Constitutional: No fever/chills Eyes: No visual changes.  Cardiovascular: No chest pain. Respiratory: No cough. No shortness of breath. No wheezing.  Gastrointestinal: No abdominal pain.  No nausea, vomiting. Musculoskeletal: Positive for back pain.  Skin: Negative for rash, skin sores. Neurological: Negative for headaches, focal weakness or numbness. No saddle paresthesias nor tingling. 10-point ROS otherwise negative.  ____________________________________________   PHYSICAL EXAM:  VITAL SIGNS: ED Triage Vitals  Enc Vitals Group     BP 05/28/16 0852 112/65 mmHg     Pulse Rate 05/28/16 0852 80     Resp 05/28/16 0852 18     Temp 05/28/16 0852 98.3 F (36.8 C)     Temp Source 05/28/16 0852 Oral     SpO2 05/28/16 0852 100 %     Weight 05/28/16 0852 190 lb (86.183 kg)     Height 05/28/16 0852 5\' 11"  (1.803 m)     Head Cir --      Peak Flow --      Pain Score 05/28/16 0852 8     Pain Loc --      Pain Edu? --      Excl. in GC? --      Constitutional: Alert and oriented. Well appearing and in no acute distress. Eyes: Conjunctivae are normal.  Head: Atraumatic. Neck: No  cervical spine tenderness to palpation. Supple with full range of motion. Hematological/Lymphatic/Immunilogical: No cervical lymphadenopathy. Cardiovascular: Normal rate, regular rhythm. Normal S1 and S2.  Good peripheral circulation. Respiratory: Normal respiratory effort without tachypnea or retractions. Lungs CTAB with breath sounds noted in all lung fields. Musculoskeletal: Full range of motion of the lumbar spine without pain. Tenderness to palpation about the left paraspinal thoracic region at the left subscapular border.  Mild muscle spasm is appreciated in this area. No tenderness to palpation about the lumbar sacral spinal areas. No SI joint tenderness. No pain with pressure applied to bilateral hips.  Neurologic:  Normal speech and language. No gross focal neurologic deficits are appreciated.   Skin:  Skin is warm, dry and intact. No rash, skin sores, redness, swelling noted. Psychiatric: Mood and affect are normal. Speech and behavior are normal. Patient exhibits appropriate insight and judgement.   ____________________________________________   LABS  None ____________________________________________  EKG  None ____________________________________________  RADIOLOGY  None ____________________________________________    PROCEDURES  Procedure(s) performed: None   Medications - No data to display   ____________________________________________   INITIAL IMPRESSION / ASSESSMENT AND PLAN / ED COURSE  Patient's diagnosis is consistent with muscle spasm of back. Patient will be discharged home with prescriptions for Flexeril and Naprosyn to take as directed. Patient may also take Tylenol as needed for additional pain control. Advised patient to apply warm heat to the affected area 20 minutes 3-4 times daily as needed and continue to complete range of motion exercises as discussed in his visit today. Patient is to follow up with [facility] if symptoms persist past this treatment course. Patient is given ED precautions to return to the ED for any worsening or new symptoms.      ____________________________________________  FINAL CLINICAL IMPRESSION(S) / ED DIAGNOSES  Final diagnoses:  Muscle spasm of back      NEW MEDICATIONS STARTED DURING THIS VISIT:  Discharge Medication List as of 05/28/2016  9:29 AM    START taking these medications   Details  cyclobenzaprine (FLEXERIL) 10 MG tablet Take 1 tablet (10 mg total) by mouth 3 (three) times daily as needed for muscle spasms., Starting 05/28/2016, Until Discontinued, Print    naproxen (NAPROSYN) 500 MG tablet Take 1 tablet (500 mg total) by mouth 2 (two) times daily with a meal., Starting 05/28/2016, Until Discontinued, Print             Hope Pigeon, PA-C 05/28/16 2956  Jene Every, MD 05/28/16 [phone]

## 2016-05-28 NOTE — ED Notes (Signed)
See triage note   States he developed pain to lower back on monday  Now pain radiates to upper back  Denies any injury  Ambulates well also denies any fever trauma,n/v or urinary sx's

## 2016-05-28 NOTE — Discharge Instructions (Signed)
Back Exercises °The following exercises strengthen the muscles that help to support the back. They also help to keep the lower back flexible. Doing these exercises can help to prevent back pain or lessen existing pain. °If you have back pain or discomfort, try doing these exercises 2-3 times each day or as told by your health care provider. When the pain goes away, do them once each day, but increase the number of times that you repeat the steps for each exercise (do more repetitions). If you do not have back pain or discomfort, do these exercises once each day or as told by your health care provider. °EXERCISES °Single Knee to Chest °Repeat these steps 3-5 times for each leg: °· Lie on your back on a firm bed or the floor with your legs extended. °· Bring one knee to your chest. Your other leg should stay extended and in contact with the floor. °· Hold your knee in place by grabbing your knee or thigh. °· Pull on your knee until you feel a gentle stretch in your lower back. °· Hold the stretch for 10-30 seconds. °· Slowly release and straighten your leg. °Pelvic Tilt °Repeat these steps 5-10 times: °· Lie on your back on a firm bed or the floor with your legs extended. °· Bend your knees so they are pointing toward the ceiling and your feet are flat on the floor. °· Tighten your lower abdominal muscles to press your lower back against the floor. This motion will tilt your pelvis so your tailbone points up toward the ceiling instead of pointing to your feet or the floor. °· With gentle tension and even breathing, hold this position for 5-10 seconds. °Cat-Cow °Repeat these steps until your lower back becomes more flexible: °· Get into a hands-and-knees position on a firm surface. Keep your hands under your shoulders, and keep your knees under your hips. You may place padding under your knees for comfort. °· Let your head hang down, and point your tailbone toward the floor so your lower back becomes rounded like the  back of a cat. °· Hold this position for 5 seconds. °· Slowly lift your head and point your tailbone up toward the ceiling so your back forms a sagging arch like the back of a cow. °· Hold this position for 5 seconds. °Press-Ups °Repeat these steps 5-10 times: °· Lie on your abdomen (face-down) on the floor. °· Place your palms near your head, about shoulder-width apart. °· While you keep your back as relaxed as possible and keep your hips on the floor, slowly straighten your arms to raise the top half of your body and lift your shoulders. Do not use your back muscles to raise your upper torso. You may adjust the placement of your hands to make yourself more comfortable. °· Hold this position for 5 seconds while you keep your back relaxed. °· Slowly return to lying flat on the floor. °Bridges °Repeat these steps 10 times: °· Lie on your back on a firm surface. °· Bend your knees so they are pointing toward the ceiling and your feet are flat on the floor. °· Tighten your buttocks muscles and lift your buttocks off of the floor until your waist is at almost the same height as your knees. You should feel the muscles working in your buttocks and the back of your thighs. If you do not feel these muscles, slide your feet 1-2 inches farther away from your buttocks. °· Hold this position for 3-5   seconds.  Slowly lower your hips to the starting position, and allow your buttocks muscles to relax completely. If this exercise is too easy, try doing it with your arms crossed over your chest. Abdominal Crunches Repeat these steps 5-10 times: 1. Lie on your back on a firm bed or the floor with your legs extended. 2. Bend your knees so they are pointing toward the ceiling and your feet are flat on the floor. 3. Cross your arms over your chest. 4. Tip your chin slightly toward your chest without bending your neck. 5. Tighten your abdominal muscles and slowly raise your trunk (torso) high enough to lift your shoulder blades  a tiny bit off of the floor. Avoid raising your torso higher than that, because it can put too much stress on your low back and it does not help to strengthen your abdominal muscles. 6. Slowly return to your starting position. Back Lifts Repeat these steps 5-10 times: 1. Lie on your abdomen (face-down) with your arms at your sides, and rest your forehead on the floor. 2. Tighten the muscles in your legs and your buttocks. 3. Slowly lift your chest off of the floor while you keep your hips pressed to the floor. Keep the back of your head in line with the curve in your back. Your eyes should be looking at the floor. 4. Hold this position for 3-5 seconds. 5. Slowly return to your starting position. SEEK MEDICAL CARE IF:  Your back pain or discomfort gets much worse when you do an exercise.  Your back pain or discomfort does not lessen within 2 hours after you exercise. If you have any of these problems, stop doing these exercises right away. Do not do them again unless your health care provider says that you can. SEEK IMMEDIATE MEDICAL CARE IF:  You develop sudden, severe back pain. If this happens, stop doing the exercises right away. Do not do them again unless your health care provider says that you can.   This information is not intended to replace advice given to you by your health care provider. Make sure you discuss any questions you have with your health care provider.   Document Released: 01/07/2005 Document Revised: 08/21/2015 Document Reviewed: 01/24/2015 Elsevier Interactive Patient Education 2016 [place] therapy can help ease sore, stiff, injured, and tight muscles and joints. Heat relaxes your muscles, which may help ease your pain. Heat therapy should only be used on old, pre-existing, or long-lasting (chronic) injuries. Do not use heat therapy unless told by your doctor. HOW TO USE HEAT THERAPY There are several different kinds of heat therapy,  including:  Moist heat pack.  Warm water bath.  Hot water bottle.  Electric heating pad.  Heated gel pack.  Heated wrap.  Electric heating pad. GENERAL HEAT THERAPY RECOMMENDATIONS   Do not sleep while using heat therapy. Only use heat therapy while you are awake.  Your skin may turn pink while using heat therapy. Do not use heat therapy if your skin turns red.  Do not use heat therapy if you have new pain.  High heat or long exposure to heat can cause burns. Be careful when using heat therapy to avoid burning your skin.  Do not use heat therapy on areas of your skin that are already irritated, such as with a rash or sunburn. GET HELP IF:   You have blisters, redness, swelling (puffiness), or numbness.  You have new pain.  Your pain is worse.  MAKE SURE YOU: °· Understand these instructions. °· Will watch your condition. °· Will get help right away if you are not doing well or get worse. °  °This information is not intended to replace advice given to you by your health care provider. Make sure you discuss any questions you have with your health care provider. °  °Document Released: 02/22/2012 Document Revised: 12/21/2014 Document Reviewed: 01/23/2014 °Elsevier Interactive Patient Education ©2016 Elsevier Inc. ° °Muscle Cramps and Spasms °Muscle cramps and spasms occur when a muscle or muscles tighten and you have no control over this tightening (involuntary muscle contraction). They are a common problem and can develop in any muscle. The most common place is in the calf muscles of the leg. Both muscle cramps and muscle spasms are involuntary muscle contractions, but they also have differences:  °· Muscle cramps are sporadic and painful. They may last a few seconds to a quarter of an hour. Muscle cramps are often more forceful and last longer than muscle spasms. °· Muscle spasms may or may not be painful. They may also last just a few seconds or much longer. °CAUSES  °It is uncommon  for cramps or spasms to be due to a serious underlying problem. In many cases, the cause of cramps or spasms is unknown. Some common causes are:  °· Overexertion.   °· Overuse from repetitive motions (doing the same thing over and over).   °· Remaining in a certain position for a long period of time.   °· Improper preparation, form, or technique while performing a sport or activity.   °· Dehydration.   °· Injury.   °· Side effects of some medicines.   °· Abnormally low levels of the salts and ions in your blood (electrolytes), especially potassium and calcium. This could happen if you are taking water pills (diuretics) or you are pregnant.   °Some underlying medical problems can make it more likely to develop cramps or spasms. These include, but are not limited to:  °· Diabetes.   °· Parkinson disease.   °· Hormone disorders, such as thyroid problems.   °· Alcohol abuse.   °· Diseases specific to muscles, joints, and bones.   °· Blood vessel disease where not enough blood is getting to the muscles.   °HOME CARE INSTRUCTIONS  °· Stay well hydrated. Drink enough water and fluids to keep your urine clear or pale yellow. °· It may be helpful to massage, stretch, and relax the affected muscle. °· For tight or tense muscles, use a warm towel, heating pad, or hot shower water directed to the affected area. °· If you are sore or have pain after a cramp or spasm, applying ice to the affected area may relieve discomfort. °¨ Put ice in a plastic bag. °¨ Place a towel between your skin and the bag. °¨ Leave the ice on for 15-20 minutes, 03-04 times a day. °· Medicines used to treat a known cause of cramps or spasms may help reduce their frequency or severity. Only take over-the-counter or prescription medicines as directed by your caregiver. °SEEK MEDICAL CARE IF:  °Your cramps or spasms get more severe, more frequent, or do not improve over time.  °MAKE SURE YOU:  °· Understand these instructions. °· Will watch your  condition. °· Will get help right away if you are not doing well or get worse. °  °This information is not intended to replace advice given to you by your health care provider. Make sure you discuss any questions you have with your health care provider. °  °Document Released: 05/22/2002   Document Revised: 03/27/2013 Document Reviewed: 11/16/2012 Elsevier Interactive Patient Education [employer].

## 2016-05-28 NOTE — ED Notes (Signed)
States he was seen earlier this week for abd pain and back pain, now states his back pain is worse, states he does a lot of heavy lifting at work

## 2017-05-02 ENCOUNTER — Encounter: Payer: Self-pay | Admitting: Emergency Medicine

## 2017-05-02 ENCOUNTER — Emergency Department
Admission: EM | Admit: 2017-05-02 | Discharge: 2017-05-02 | Disposition: A | Discharge status: Home / Self Care | Payer: BLUE CROSS/BLUE SHIELD | Attending: Emergency Medicine | Admitting: Emergency Medicine

## 2017-05-02 DIAGNOSIS — H00012 Hordeolum externum right lower eyelid: Secondary | ICD-10-CM | POA: Insufficient documentation

## 2017-05-02 DIAGNOSIS — H5711 Ocular pain, right eye: Secondary | ICD-10-CM | POA: Diagnosis present

## 2017-05-02 MED ORDER — NAPHAZOLINE HCL 0.1 % OP SOLN: 1.0000 [drp] | Freq: Four times a day (QID) | OPHTHALMIC | 0 refills | Status: DC | PRN

## 2017-05-02 MED ORDER — GENTAMICIN SULFATE 0.3 % OP SOLN: 1.0000 [drp] | OPHTHALMIC | 0 refills | Status: DC

## 2017-05-02 NOTE — ED Provider Notes (Signed)
[facility] Emergency Department Provider Note   ____________________________________________   First MD Initiated Contact with Patient 05/02/17 1604     (approximate)  I have reviewed the triage vital signs and the nursing notes.   HISTORY  Chief Complaint Eye Pain    HPI Phillip Howell is a 31 y.o. male patient complaining of pain nasal aspect the right after 2 days. Patient denies foreign body sensation. Denies any known injury. Patient stated this increased watering but no purulent discharge. Patient denies vision change.patient described a pain as "sharp". Patient rates the pain as 8/10. Patient stated no relief over-the-counter eyedrops.   History reviewed. No pertinent past medical history.  There are no active problems to display for this patient.   History reviewed. No pertinent surgical history.  Prior to Admission medications   Medication Sig Start Date End Date Taking? Authorizing Provider  cyclobenzaprine (FLEXERIL) 10 MG tablet Take 1 tablet (10 mg total) by mouth 3 (three) times daily as needed for muscle spasms. 05/28/16   Hagler, Jami L, PA-C  gentamicin (GARAMYCIN) 0.3 % ophthalmic solution Place 1 drop into the right eye every 4 (four) hours. 05/02/17   Joni Reining, PA-C  naphazoline (NAPHCON) 0.1 % ophthalmic solution Place 1 drop into both eyes 4 (four) times daily as needed for irritation. 05/02/17   Joni Reining, PA-C  naproxen (NAPROSYN) 500 MG tablet Take 1 tablet (500 mg total) by mouth 2 (two) times daily with a meal. 05/28/16   Hagler, Jami L, PA-C  traMADol (ULTRAM) 50 MG tablet Take 1 tablet (50 mg total) by mouth every 6 (six) hours as needed. 05/25/16 05/25/17  Minna Antis, MD    Allergies Patient has no known allergies.  No family history on file.  Social History Social History  Substance Use Topics  . Smoking status: Never Smoker  . Smokeless tobacco: Never Used  . Alcohol use No    Review of  Systems  Constitutional: No fever/chills Eyes: No visual changes. Sharp pains nasal aspect the right eye ENT: No sore throat. Cardiovascular: Denies chest pain. Respiratory: Denies shortness of breath. Neurological: Negative for headaches, focal weakness or numbness.   ____________________________________________   PHYSICAL EXAM:  VITAL SIGNS: ED Triage Vitals  Enc Vitals Group     BP 05/02/17 1517 (!) 112/53     Pulse Rate 05/02/17 1517 75     Resp 05/02/17 1517 16     Temp 05/02/17 1517 98.1 F (36.7 C)     Temp Source 05/02/17 1517 Oral     SpO2 05/02/17 1517 99 %     Weight --      Height --      Head Circumference --      Peak Flow --      Pain Score 05/02/17 1516 8     Pain Loc --      Pain Edu? --      Excl. in GC? --     Constitutional: Alert and oriented. Well appearing and in no acute distress. Eyes: Conjunctivae are normal. PERRL. EOMI. See vision acuity. Papular lesion nasal aspect right lower eyelid Head: Atraumatic. Nose: No congestion/rhinnorhea. lymphadenopathy. Cardiovascular: Normal rate, regular rhythm. Grossly normal heart sounds.  Good peripheral circulation. Respiratory: Normal respiratory effort.  No retractions. Lungs CTAB. Neurologic:  Normal speech and language. No gross focal neurologic deficits are appreciated. No gait instability. Skin:  Skin is warm, dry and intact. No rash noted. Psychiatric: Mood and affect are normal.  Speech and behavior are normal.  ____________________________________________   LABS (all labs ordered are listed, but only abnormal results are displayed)  Labs Reviewed - No data to display ____________________________________________  EKG   ____________________________________________  RADIOLOGY   ____________________________________________   PROCEDURES  Procedure(s) performed: None  Procedures  Critical Care performed: No  ____________________________________________   INITIAL IMPRESSION /  ASSESSMENT AND PLAN / ED COURSE  Pertinent labs & imaging results that were available during my care of the patient were reviewed by me and considered in my medical decision making (see chart for details).  Sty right lower eyelid. Patient given discharge care instruction. Patient advised follow-up with PCP if no improvement or worsening complaint in 2 days.      ____________________________________________   FINAL CLINICAL IMPRESSION(S) / ED DIAGNOSES  Final diagnoses:  Hordeolum externum of right lower eyelid      NEW MEDICATIONS STARTED DURING THIS VISIT:  New Prescriptions   GENTAMICIN (GARAMYCIN) 0.3 % OPHTHALMIC SOLUTION    Place 1 drop into the right eye every 4 (four) hours.   NAPHAZOLINE (NAPHCON) 0.1 % OPHTHALMIC SOLUTION    Place 1 drop into both eyes 4 (four) times daily as needed for irritation.     Note:  This document was prepared using Dragon voice recognition software and may include unintentional dictation errors.    Joni ReiningSmith, Keithon Mccoin K, PA-C 05/02/17 1619    Schaevitz, Myra Rudeavid Matthew, MD 05/02/17 [phone]

## 2017-05-02 NOTE — ED Notes (Signed)
See triage note.  Eye pain x 2 days with increased watering.

## 2017-05-02 NOTE — ED Triage Notes (Signed)
Pt states that he has been having pain in the corner of his right eye for 2 days. Pt denies any know injury. Denies discharge but states that he has noticed Increased watering

## 2017-12-03 ENCOUNTER — Encounter: Payer: Self-pay | Admitting: Internal Medicine

## 2017-12-03 ENCOUNTER — Ambulatory Visit (INDEPENDENT_AMBULATORY_CARE_PROVIDER_SITE_OTHER): Payer: 59 | Admitting: Internal Medicine

## 2017-12-03 VITALS — BP 112/64 | HR 69 | Temp 98.0°F | Ht 71.75 in | Wt 206.2 lb

## 2017-12-03 DIAGNOSIS — Z23 Encounter for immunization: Secondary | ICD-10-CM

## 2017-12-03 DIAGNOSIS — J309 Allergic rhinitis, unspecified: Secondary | ICD-10-CM

## 2017-12-03 DIAGNOSIS — L309 Dermatitis, unspecified: Secondary | ICD-10-CM | POA: Insufficient documentation

## 2017-12-03 DIAGNOSIS — Z0000 Encounter for general adult medical examination without abnormal findings: Secondary | ICD-10-CM | POA: Diagnosis not present

## 2017-12-03 DIAGNOSIS — Z1329 Encounter for screening for other suspected endocrine disorder: Secondary | ICD-10-CM

## 2017-12-03 DIAGNOSIS — L301 Dyshidrosis [pompholyx]: Secondary | ICD-10-CM

## 2017-12-03 DIAGNOSIS — Z1159 Encounter for screening for other viral diseases: Secondary | ICD-10-CM | POA: Diagnosis not present

## 2017-12-03 LAB — CBC WITH DIFFERENTIAL/PLATELET
Basophils Absolute: 0.1 10*3/uL (ref 0.0–0.1)
Basophils Relative: 1.2 % (ref 0.0–3.0)
EOS PCT: 3.8 % (ref 0.0–5.0)
Eosinophils Absolute: 0.2 10*3/uL (ref 0.0–0.7)
HCT: 40 % (ref 39.0–52.0)
HEMOGLOBIN: 13.8 g/dL (ref 13.0–17.0)
LYMPHS ABS: 1.6 10*3/uL (ref 0.7–4.0)
Lymphocytes Relative: 31.2 % (ref 12.0–46.0)
MCHC: 34.4 g/dL (ref 30.0–36.0)
MCV: 99.2 fl (ref 78.0–100.0)
Monocytes Absolute: 0.5 10*3/uL (ref 0.1–1.0)
Monocytes Relative: 10.9 % (ref 3.0–12.0)
NEUTROS ABS: 2.6 10*3/uL (ref 1.4–7.7)
Neutrophils Relative %: 52.9 % (ref 43.0–77.0)
Platelets: 203 10*3/uL (ref 150.0–400.0)
RBC: 4.04 Mil/uL — ABNORMAL LOW (ref 4.22–5.81)
RDW: 12.8 % (ref 11.5–15.5)
WBC: 5 10*3/uL (ref 4.0–10.5)

## 2017-12-03 LAB — COMPREHENSIVE METABOLIC PANEL
ALT: 13 U/L (ref 0–53)
AST: 21 U/L (ref 0–37)
Albumin: 4.2 g/dL (ref 3.5–5.2)
Alkaline Phosphatase: 56 U/L (ref 39–117)
BUN: 17 mg/dL (ref 6–23)
CHLORIDE: 106 meq/L (ref 96–112)
CO2: 29 mEq/L (ref 19–32)
Calcium: 8.9 mg/dL (ref 8.4–10.5)
Creatinine, Ser: 0.74 mg/dL (ref 0.40–1.50)
GFR: 130.31 mL/min (ref >60.00)
Glucose, Bld: 74 mg/dL (ref 70–99)
Potassium: 3.6 mEq/L (ref 3.5–5.1)
SODIUM: 140 meq/L (ref 135–145)
Total Bilirubin: 0.6 mg/dL (ref 0.2–1.2)
Total Protein: 6.9 g/dL (ref 6.0–8.3)

## 2017-12-03 LAB — URINALYSIS, ROUTINE W REFLEX MICROSCOPIC
Bilirubin Urine: NEGATIVE
HGB URINE DIPSTICK: NEGATIVE
KETONES UR: NEGATIVE
LEUKOCYTES UA: NEGATIVE
Nitrite: NEGATIVE
RBC / HPF: NONE SEEN (ref 0–?)
Specific Gravity, Urine: >=1.03 — AB (ref 1.000–1.030)
Total Protein, Urine: NEGATIVE
URINE GLUCOSE: NEGATIVE
Urobilinogen, UA: 0.2 (ref 0.0–1.0)
pH: 6 (ref 5.0–8.0)

## 2017-12-03 LAB — TSH: TSH: 1.69 u[IU]/mL (ref 0.35–4.50)

## 2017-12-03 LAB — T4, FREE: Free T4: 0.74 ng/dL (ref 0.60–1.60)

## 2017-12-03 MED ORDER — TRIAMCINOLONE ACETONIDE 0.5 % EX CREA: TOPICAL_CREAM | CUTANEOUS | 2 refills | Status: DC

## 2017-12-03 MED ORDER — ALUMINUM CHLORIDE 20 % EX SOLN: Freq: Every day | CUTANEOUS | 2 refills | Status: DC

## 2017-12-03 NOTE — Progress Notes (Signed)
Chief Complaint  Patient presents with  . Establish Care   New patient  1. C/o rash to left hand x 2 weeks at times itchy. He does work at a [employer]. He does report sweating on his hands at work. This is new.  2. C/o left ear fullness, ringing at times w/o hearing loss. Does report h/o allergies dust, etc.    Review of Systems  Constitutional: Negative for weight loss.  HENT: Negative for hearing loss.        Left ear popping, ringing a times feels full   Eyes:       No red eye or vision problems   Respiratory: Negative for shortness of breath.   Cardiovascular: Negative for chest pain.  Gastrointestinal: Negative for abdominal pain and blood in stool.  Musculoskeletal: Positive for back pain.       Upper back pain x 1 week   Skin: Positive for rash.  Neurological: Negative for headaches.  Psychiatric/Behavioral: Negative for memory loss.   Past Medical History:  Diagnosis Date  . Back pain    upper   . Dyshidrotic eczema    L hand    History reviewed. No pertinent surgical history. History reviewed. No pertinent family history. Social History   Socioeconomic History  . Marital status: Married    Spouse name: Not on file  . Number of children: Not on file  . Years of education: Not on file  . Highest education level: Not on file  Social Needs  . Financial resource strain: Not on file  . Food insecurity - worry: Not on file  . Food insecurity - inability: Not on file  . Transportation needs - medical: Not on file  . Transportation needs - non-medical: Not on file  Occupational History  . Not on file  Tobacco Use  . Smoking status: Former Games developermoker  . Smokeless tobacco: Never Used  Substance and Sexual Activity  . Alcohol use: No  . Drug use: No  . Sexual activity: Not on file  Other Topics Concern  . Not on file  Social History Narrative  . Not on file   Current Meds  Medication Sig  . cyclobenzaprine (FLEXERIL) 10 MG tablet Take 1 tablet (10  mg total) by mouth 3 (three) times daily as needed for muscle spasms.  . naproxen (NAPROSYN) 500 MG tablet Take 1 tablet (500 mg total) by mouth 2 (two) times daily with a meal.   No Known Allergies No results found for this or any previous visit (from the past 2160 hour(s)). Objective  Body mass index is 28.17 kg/m. Wt Readings from Last 3 Encounters:  12/03/17 206 lb 4 oz (93.6 kg)  05/28/16 190 lb (86.2 kg)  05/25/16 190 lb (86.2 kg)   Temp Readings from Last 3 Encounters:  12/03/17 98 F (36.7 C) (Oral)  05/02/17 98.1 F (36.7 C) (Oral)  05/28/16 98.3 F (36.8 C) (Oral)   BP Readings from Last 3 Encounters:  12/03/17 112/64  05/02/17 111/64  05/28/16 112/65   Pulse Readings from Last 3 Encounters:  12/03/17 69  05/02/17 66  05/28/16 80   O2 room air 99% Physical Exam  Constitutional: He is oriented to person, place, and time and well-developed, well-nourished, and in no distress. Vital signs are normal.  HENT:  Head: Normocephalic and atraumatic.  Mouth/Throat: Oropharynx is clear and moist and mucous membranes are normal.  Eyes: Conjunctivae are normal. Pupils are equal, round, and reactive to light.  Cardiovascular: Normal rate, regular rhythm and normal heart sounds.  Neg leg edema b/l   Pulmonary/Chest: Effort normal and breath sounds normal.  Abdominal: Soft. Bowel sounds are normal. There is no tenderness.  Neurological: He is alert and oriented to person, place, and time. Gait normal. Gait normal.  Skin: Skin is warm and dry. Rash noted.  Dyshidrotic eczema to left hand   Psychiatric: Mood, memory, affect and judgment normal.  Nursing note and vitals reviewed.   Assessment   1. Dyshydrotic eczema 2. Left eustachian dysfunction/allergies  3. HM Plan  1. Steroids bid until better then drysol to keep hands from sweating  2. Try Zyrtec, Flonase prn  3.  Given flu shot today  Tdap had 09/10/15  Former smoker congratulated on Continental Airlinesquitting  Labs today   Declines STD check   Had not seen eye MD no eye sx's currently.   Provider: Dr. French Anaracy McLean-Scocuzza-Internal Medicine

## 2017-12-03 NOTE — Patient Instructions (Addendum)
Try over the counter Zyrtec at night (or when you go to bed) for your ear symptoms Try Flonase 1-2 sprays for ear symptoms  These are over the counter.  Read below  Follow up in 6-12 months sooner if needed  Try Drysol to keep your hands from sweating after rash improves   Dyshidrotic Eczema Dyshidrotic eczema (pompholyx) is a type of eczema that causes very itchy (pruritic), fluid-filled blisters (vesicles) to form on the hands and feet. It can affect people of any age, but is more common before the age of 31. There is no cure, but treatment and certain lifestyle changes can help relieve symptoms. What are the causes? The cause of this condition is not known. What increases the risk? You are more likely to develop this condition if:  You wash your hands frequently.  You have a personal history or family history of eczema, allergies, asthma, or hay fever.  You are allergic to metals such as nickel or cobalt.  You work with cement.  You smoke.  What are the signs or symptoms? Symptoms of this condition may affect the hands, feet, or both. Symptoms may come and go (recur), and may include:  Severe itching, which may happen before blisters appear.  Blisters. These may form suddenly. ? In the early stages, blisters may form near the fingertips. ? In severe cases, blisters may grow to large blister masses (bullae). ? Blisters resolve in 2-3 weeks without bursting. This is followed by a dry phase in which itching eases.  Pain and swelling.  Cracks or long, narrow openings (fissures) in the skin.  Severe dryness.  Ridges on the nails.  How is this diagnosed? This condition may be diagnosed based on:  A physical exam.  Your symptoms.  Your medical history.  Skin scrapings to rule out a fungal infection.  Testing a swab of fluid for bacteria (culture).  Removing and checking a small piece of skin (biopsy) in order to test for infection or to rule out other  conditions.  Skin patch tests. These tests involve taking patches that contain possible allergens and placing them on your back. Your health care provider will wait a few days and then check to see if an allergic reaction occurred. These tests may be done if your health care provider suspects allergic reactions, or to rule out other types of eczema.  You may be referred to a health care provider who specializes in the skin (dermatologist) to help diagnose and treat this condition. How is this treated? There is no cure for this condition, but treatment can help relieve symptoms. Depending on how many blisters you have and how severe they are, your health care provider may suggest:  Avoiding allergens, irritants, or triggers that worsen symptoms. This may involve lifestyle changes such as: ? Using different lotions or soaps. ? Avoiding hot weather or places that will cause you to sweat a lot. ? Managing stress with coping techniques such as relaxation and exercise, and asking for help when you need it. ? Diet changes as recommended by your health care provider.  Using a clean, damp towel (cool compress) to relieve symptoms.  Soaking in a bath that contains a type of salt that relieves irritation (aluminum acetate soaks).  Medicine taken by mouth to reduce itching (oral antihistamines).  Medicine applied to the skin to reduce swelling and irritation (topical corticosteroids).  Medicine that reduces the activity of the body's disease-fighting system (immunosuppressants) to treat inflammation. This may be given in  severe cases.  Antibiotic medicines to treat bacterial infection.  Light therapy (phototherapy). This involves shining ultraviolet (UV) light on affected skin in order to reduce itchiness and inflammation.  Follow these instructions at home: Bathing and skin care  Wash skin gently. After bathing or washing your hands, pat your skin dry. Avoid rubbing your skin.  Remove all  jewelry before bathing. If the skin under the jewelry stays wet, blisters may form or get worse.  Apply cool compresses as told by your health care provider: ? Soak a clean towel in cool water. ? Wring out excess water until towel is damp. ? Place the towel over affected skin. Leave the towel on for 20 minutes at a time, 2-3 times a day.  Use mild soaps, cleansers, and lotions that do not contain dyes, perfumes, or other irritants.  Keep your skin hydrated. To do this: ? Avoid very hot water. Take lukewarm baths or showers. ? Apply moisturizer within three minutes of bathing. This locks in moisture. Medicines  Take and apply over-the-counter and prescription medicines only as told by your health care provider.  If you were prescribed antibiotic medicine, take or apply it as told by your health care provider. Do not stop using the antibiotic even if you start to feel better. General instructions  Identify and avoid triggers and allergens.  Keep fingernails short to avoid breaking open the skin while scratching.  Use waterproof gloves to protect your hands when doing work that keeps your hands wet for a long time.  Wear socks to keep your feet dry.  Do not use any products that contain nicotine or tobacco, such as cigarettes and e-cigarettes. If you need help quitting, ask your health care provider.  Keep all follow-up visits as told by your health care provider. This is important. Contact a health care provider if:  You have symptoms that do not go away.  You have signs of infection, such as: ? Crusting, pus, or a bad smell. ? More redness, swelling, or pain. ? Increased warmth in the affected area. Summary  Dyshidrotic eczema (pompholyx) is a type of eczema that causes very itchy (pruritic), fluid-filled blisters (vesicles) to form on the hands and feet.  The cause of this condition is not known.  There is no cure for this condition, but treatment can help relieve  symptoms. Treatment depends on how many blisters you have and how severe they are.  Use mild soaps, cleansers, and lotions that do not contain dyes, perfumes, or other irritants. Keep your skin hydrated. This information is not intended to replace advice given to you by your health care provider. Make sure you discuss any questions you have with your health care provider. Document Released: 04/15/2017 Document Revised: 04/15/2017 Document Reviewed: 04/15/2017 Elsevier Interactive Patient Education  2018 [employer]

## 2017-12-06 ENCOUNTER — Telehealth: Payer: Self-pay | Admitting: Internal Medicine

## 2017-12-06 NOTE — Telephone Encounter (Signed)
Called in and was given lab results.  Liver and kidneys normal Thyroid labs normal Urine shows he needs to drink more water has has mucus which can be normal and bacteria contaminant    Blood counts normal We need to check cholesterol in the future Call me back and schedule an appt if rash is not better.   No further questions.

## 2017-12-10 ENCOUNTER — Telehealth: Payer: Self-pay | Admitting: Internal Medicine

## 2017-12-10 NOTE — Telephone Encounter (Signed)
Please advise 

## 2017-12-10 NOTE — Telephone Encounter (Signed)
Reason for call: seen in urgent care, 2-3 weeks ago   Symptoms: bilateral pain in  rotoator cuff area,  , between muscle in elbow and shoulder.  Duration 2-3 weeks ago Medications: given muscle relaxer, pain medication by urgent care Last seen for this problem:2-3 weeks ago Seen by: urgent care clinic Left message for patient to call , may want to call urgent care back to see if they can refer him to ortho Appointment scheduled for 12/23/16

## 2017-12-10 NOTE — Telephone Encounter (Signed)
Copied from CRM [id number]. Topic: Quick Communication - See Telephone Encounter >> Dec 10, 2017  2:47 PM Rudi CocoLathan, Jaysten Essner M, NT wrote: CRM for notification. See Telephone encounter for:   12/10/17. Pt. [facility]. Pt. Is still having back issues. Pt can be reached at [phone]

## 2017-12-23 DIAGNOSIS — M542 Cervicalgia: Secondary | ICD-10-CM | POA: Insufficient documentation

## 2018-01-05 ENCOUNTER — Ambulatory Visit: Payer: 59 | Admitting: Internal Medicine

## 2018-01-07 ENCOUNTER — Telehealth: Payer: Self-pay

## 2018-01-07 NOTE — Telephone Encounter (Signed)
-----   Message from Bevelyn Bucklesracy N McLean-Scocuzza, MD sent at 01/05/2018  7:02 PM EST ----- Please call pt and see he needed appt  If he was able to see ortho?  Thanks [employer]

## 2018-01-07 NOTE — Telephone Encounter (Signed)
Did he see ortho? Left voice mail to call back ok for [facility] to speak to patient

## 2018-01-10 NOTE — Telephone Encounter (Signed)
Attempted to contact pt to find out if he was able to see ortho; left message on voice mail at [phone].

## 2018-03-30 NOTE — Progress Notes (Signed)
Subjective:    Patient ID: Phillip Howell, male    DOB: 07/16/1986, 32 y.o.   MRN: [id number]  HPI  Mr. Howell is a 32 year old male who presents today with a rash that he suspects is a flare of eczema on his left hand. He has a history of dyshidrotic eczema that presents on his left hand. He was evaluated and treated 11/2017 with triamcinolone to be used BID x 2 weeks then prn with drysol to be used to decrease sweating of hands. 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 No Associated symptoms: Pruritus in the morning Denies: fever, chills sweats, myalgias, difficulty swallowing, hoarseness, SOB, N/V, abdominal pain, or tightening of throat.  No new exposures of soaps, lotions, laundry detergent, fabric softeners, foods, medications, plants, animals, or insects.  Treatment at home includes triamcinolone cream and drysol that has provided moderate benefit.     Review of Systems  Constitutional: Negative for chills, fatigue and fever.  HENT: Negative for congestion, postnasal drip and sneezing.   Eyes: Negative for itching.  Respiratory: Negative for cough, shortness of breath and wheezing.   Cardiovascular: Negative for chest pain and palpitations.  Gastrointestinal: Negative for abdominal pain, blood in stool, constipation, nausea and vomiting.   Past Medical History:  Diagnosis Date  . Allergy    i.e dust  . Back pain    upper   . Chicken pox   . Dyshidrotic eczema    L hand      Social History   Socioeconomic History  . Marital status: Married    Spouse name: Not on file  . Number of children: Not on file  . Years of education: Not on file  . Highest education level: Not on file  Occupational History  . Not on file  Social Needs  . Financial resource strain: Not on file  . Food insecurity:    Worry: Not on file    Inability: Not on  file  . Transportation needs:    Medical: Not on file    Non-medical: Not on file  Tobacco Use  . Smoking status: Former Games developermoker  . Smokeless tobacco: Never Used  . Tobacco comment: smoked 18 to 21 4-5 cig qd   Substance and Sexual Activity  . Alcohol use: No  . Drug use: No  . Sexual activity: Yes    Comment: wife   Lifestyle  . Physical activity:    Days per week: Not on file    Minutes per session: Not on file  . Stress: Not on file  Relationships  . Social connections:    Talks on phone: Not on file    Gets together: Not on file    Attends religious service: Not on file    Active member of club or organization: Not on file    Attends meetings of clubs or organizations: Not on file    Relationship status: Not on file  . Intimate partner violence:    Fear of current or ex partner: Not on file    Emotionally abused: Not on file    Physically abused: Not on file    Forced sexual activity: Not on file  Other Topics Concern  . Not on file  Social History Narrative   Works at [employer] with [employer]    Former smoker age [age]    From [place]  Married no kids    GED Licensed conveyancer    Past Surgical History:  Procedure Laterality Date  . NO PAST SURGERIES      Family History  Problem Relation Age of Onset  . Diabetes Father   . Mental illness Sister   . Mental retardation Sister        Down Syndrome     No Known Allergies  Current Outpatient Medications on File Prior to Visit  Medication Sig Dispense Refill  . aluminum chloride (DRYSOL) 20 % external solution Apply topically at bedtime. 1-2x/week qhs prn. Avoid on broken/rash skin. To keep hands dry 60 mL 2  . triamcinolone cream (KENALOG) 0.5 % Bid to left hand x 2 weeks then bid prn 30 g 2  . cyclobenzaprine (FLEXERIL) 10 MG tablet Take 1 tablet (10 mg total) by mouth 3 (three) times daily as needed for muscle spasms. (Patient not taking: Reported on 03/31/2018) 21 tablet 0  . naproxen (NAPROSYN) 500 MG tablet Take 1  tablet (500 mg total) by mouth 2 (two) times daily with a meal. (Patient not taking: Reported on 03/31/2018) 14 tablet 0   No current facility-administered medications on file prior to visit.     BP 100/68 (BP Location: Left Arm, Patient Position: Sitting, Cuff Size: Normal)   Pulse 63   Temp 98.5 F (36.9 C) (Oral)   Resp 16   Wt 207 lb 4 oz (94 kg)   SpO2 98%   BMI 28.30 kg/m       Objective:   Physical Exam  Constitutional: He is oriented to person, place, and time. He appears well-developed and well-nourished.  Eyes: Pupils are equal, round, and reactive to light. No scleral icterus.  Neck: Neck supple.  Cardiovascular: Normal rate and regular rhythm.  Pulmonary/Chest: Effort normal and breath sounds normal. He has no wheezes. He has no rales.  Abdominal: Soft. Bowel sounds are normal. There is no tenderness.  Musculoskeletal: He exhibits no edema.  Lymphadenopathy:    He has no cervical adenopathy.  Neurological: He is alert and oriented to person, place, and time.  Skin: Skin is warm and dry. No rash noted.  Vesicles in various stages of healing of palm of left hand. No vesicles or crusting on lateral aspects of fingers. Lichenification present  Psychiatric: He has a normal mood and affect. His behavior is normal. Judgment and thought content normal.        Assessment & Plan:  1. Dyshidrotic eczema - predniSONE (DELTASONE) 10 MG tablet; Take 4 tablets once daily for 2 days, 3 tabs daily for 2 days, 2 tabs daily for 2 days, 1 tab daily for 2 days.  Dispense: 20 tablet; Refill: 0 - clobetasol ointment (TEMOVATE) 0.05 %; Apply 1 application topically 2 (two) times daily.  Dispense: 30 g; Refill: 0  History and exam today are suggestive of dyshidrotic eczema flare and less likely allergic dermatitis. Triamcinolone has provided limited benefit during this flare. Will treat with short course of prednisone taper and provide clobetasol that can be used BID for 2 weeks and then  prn. We reviewed measures  to address flares and he will let us know if rash does not improve. Follow up with PCP if symptoms do not improve advised.  Finally, we reviewed reasons to return to care including if symptoms worsen or persist or new concerns arise- once again particularly rash that does not improve,  shortness of breath, N/V, or fever. Further advised patient to keep a diary  of food choices and also use dye/scent free products.  Reviewed importance of calling 911immediately if symptoms of SOB, difficulty swallowing, or throat tightening.   Roddie Mc, FNP-C

## 2018-03-31 ENCOUNTER — Encounter: Payer: Self-pay | Admitting: Family Medicine

## 2018-03-31 ENCOUNTER — Ambulatory Visit (INDEPENDENT_AMBULATORY_CARE_PROVIDER_SITE_OTHER): Payer: 59 | Admitting: Family Medicine

## 2018-03-31 VITALS — BP 100/68 | HR 63 | Temp 98.5°F | Resp 16 | Wt 207.2 lb

## 2018-03-31 DIAGNOSIS — L301 Dyshidrosis [pompholyx]: Secondary | ICD-10-CM

## 2018-03-31 MED ORDER — CLOBETASOL PROPIONATE 0.05 % EX OINT: 1.0000 "application " | TOPICAL_OINTMENT | Freq: Two times a day (BID) | CUTANEOUS | 0 refills | Status: DC

## 2018-03-31 MED ORDER — PREDNISONE 10 MG PO TABS: ORAL_TABLET | ORAL | 0 refills | Status: DC

## 2018-03-31 NOTE — Patient Instructions (Signed)
It was a pleasure to see you today.  Please take medication as directed and follow up with your provider if symptoms do not improve with treatment, worsen, or new symptoms develop.   Dyshidrotic Eczema Dyshidrotic eczema (pompholyx) is a type of eczema that causes very itchy (pruritic), fluid-filled blisters (vesicles) to form on the hands and feet. It can affect people of any age, but is more common before the age of 32. There is no cure, but treatment and certain lifestyle changes can help relieve symptoms. What are the causes? The cause of this condition is not known. What increases the risk? You are more likely to develop this condition if:  You wash your hands frequently.  You have a personal history or family history of eczema, allergies, asthma, or hay fever.  You are allergic to metals such as nickel or cobalt.  You work with cement.  You smoke.  What are the signs or symptoms? Symptoms of this condition may affect the hands, feet, or both. Symptoms may come and go (recur), and may include:  Severe itching, which may happen before blisters appear.  Blisters. These may form suddenly. ? In the early stages, blisters may form near the fingertips. ? In severe cases, blisters may grow to large blister masses (bullae). ? Blisters resolve in 2-3 weeks without bursting. This is followed by a dry phase in which itching eases.  Pain and swelling.  Cracks or long, narrow openings (fissures) in the skin.  Severe dryness.  Ridges on the nails.  How is this diagnosed? This condition may be diagnosed based on:  A physical exam.  Your symptoms.  Your medical history.  Skin scrapings to rule out a fungal infection.  Testing a swab of fluid for bacteria (culture).  Removing and checking a small piece of skin (biopsy) in order to test for infection or to rule out other conditions.  Skin patch tests. These tests involve taking patches that contain possible allergens and  placing them on your back. Your health care provider will wait a few days and then check to see if an allergic reaction occurred. These tests may be done if your health care provider suspects allergic reactions, or to rule out other types of eczema.  You may be referred to a health care provider who specializes in the skin (dermatologist) to help diagnose and treat this condition. How is this treated? There is no cure for this condition, but treatment can help relieve symptoms. Depending on how many blisters you have and how severe they are, your health care provider may suggest:  Avoiding allergens, irritants, or triggers that worsen symptoms. This may involve lifestyle changes such as: ? Using different lotions or soaps. ? Avoiding hot weather or places that will cause you to sweat a lot. ? Managing stress with coping techniques such as relaxation and exercise, and asking for help when you need it. ? Diet changes as recommended by your health care provider.  Using a clean, damp towel (cool compress) to relieve symptoms.  Soaking in a bath that contains a type of salt that relieves irritation (aluminum acetate soaks).  Medicine taken by mouth to reduce itching (oral antihistamines).  Medicine applied to the skin to reduce swelling and irritation (topical corticosteroids).  Medicine that reduces the activity of the body's disease-fighting system (immunosuppressants) to treat inflammation. This may be given in severe cases.  Antibiotic medicines to treat bacterial infection.  Light therapy (phototherapy). This involves shining ultraviolet (UV) light on affected skin  in order to reduce itchiness and inflammation.  Follow these instructions at home: Bathing and skin care  Wash skin gently. After bathing or washing your hands, pat your skin dry. Avoid rubbing your skin.  Remove all jewelry before bathing. If the skin under the jewelry stays wet, blisters may form or get worse.  Apply  cool compresses as told by your health care provider: ? Soak a clean towel in cool water. ? Wring out excess water until towel is damp. ? Place the towel over affected skin. Leave the towel on for 20 minutes at a time, 2-3 times a day.  Use mild soaps, cleansers, and lotions that do not contain dyes, perfumes, or other irritants.  Keep your skin hydrated. To do this: ? Avoid very hot water. Take lukewarm baths or showers. ? Apply moisturizer within three minutes of bathing. This locks in moisture. Medicines  Take and apply over-the-counter and prescription medicines only as told by your health care provider.  If you were prescribed antibiotic medicine, take or apply it as told by your health care provider. Do not stop using the antibiotic even if you start to feel better. General instructions  Identify and avoid triggers and allergens.  Keep fingernails short to avoid breaking open the skin while scratching.  Use waterproof gloves to protect your hands when doing work that keeps your hands wet for a long time.  Wear socks to keep your feet dry.  Do not use any products that contain nicotine or tobacco, such as cigarettes and e-cigarettes. If you need help quitting, ask your health care provider.  Keep all follow-up visits as told by your health care provider. This is important. Contact a health care provider if:  You have symptoms that do not go away.  You have signs of infection, such as: ? Crusting, pus, or a bad smell. ? More redness, swelling, or pain. ? Increased warmth in the affected area. Summary  Dyshidrotic eczema (pompholyx) is a type of eczema that causes very itchy (pruritic), fluid-filled blisters (vesicles) to form on the hands and feet.  The cause of this condition is not known.  There is no cure for this condition, but treatment can help relieve symptoms. Treatment depends on how many blisters you have and how severe they are.  Use mild soaps, cleansers,  and lotions that do not contain dyes, perfumes, or other irritants. Keep your skin hydrated. This information is not intended to replace advice given to you by your health care provider. Make sure you discuss any questions you have with your health care provider. Document Released: 04/15/2017 Document Revised: 04/15/2017 Document Reviewed: 04/15/2017 Elsevier Interactive Patient Education  2018 [employer].

## 2018-06-03 ENCOUNTER — Telehealth: Payer: Self-pay | Admitting: *Deleted

## 2018-06-03 ENCOUNTER — Ambulatory Visit: Payer: 59 | Admitting: Internal Medicine

## 2018-06-03 NOTE — Telephone Encounter (Signed)
Copied from CRM [phone]. Topic: Quick Communication - Appointment Cancellation >> Jun 03, 2018  7:12 AM Leafy Roobinson, Norma J wrote: Patient called to cancel appointment scheduled for [facility]. Patient HAS  rescheduled their appointment. Pt had to work over today   Route to department's PEC pool.

## 2018-06-07 ENCOUNTER — Encounter: Payer: Self-pay | Admitting: Internal Medicine

## 2018-06-07 ENCOUNTER — Ambulatory Visit (INDEPENDENT_AMBULATORY_CARE_PROVIDER_SITE_OTHER): Payer: 59 | Admitting: Internal Medicine

## 2018-06-07 VITALS — BP 102/58 | HR 57 | Temp 98.1°F | Ht 71.75 in | Wt 206.6 lb

## 2018-06-07 DIAGNOSIS — Z0184 Encounter for antibody response examination: Secondary | ICD-10-CM | POA: Diagnosis not present

## 2018-06-07 DIAGNOSIS — Z0000 Encounter for general adult medical examination without abnormal findings: Secondary | ICD-10-CM | POA: Diagnosis not present

## 2018-06-07 DIAGNOSIS — T148XXA Other injury of unspecified body region, initial encounter: Secondary | ICD-10-CM | POA: Diagnosis not present

## 2018-06-07 DIAGNOSIS — E559 Vitamin D deficiency, unspecified: Secondary | ICD-10-CM

## 2018-06-07 DIAGNOSIS — L309 Dermatitis, unspecified: Secondary | ICD-10-CM

## 2018-06-07 DIAGNOSIS — Z13220 Encounter for screening for lipoid disorders: Secondary | ICD-10-CM

## 2018-06-07 DIAGNOSIS — E663 Overweight: Secondary | ICD-10-CM | POA: Diagnosis not present

## 2018-06-07 DIAGNOSIS — J309 Allergic rhinitis, unspecified: Secondary | ICD-10-CM | POA: Diagnosis not present

## 2018-06-07 DIAGNOSIS — Z1329 Encounter for screening for other suspected endocrine disorder: Secondary | ICD-10-CM | POA: Diagnosis not present

## 2018-06-07 DIAGNOSIS — Z1159 Encounter for screening for other viral diseases: Secondary | ICD-10-CM

## 2018-06-07 DIAGNOSIS — Z1389 Encounter for screening for other disorder: Secondary | ICD-10-CM | POA: Diagnosis not present

## 2018-06-07 MED ORDER — MUPIROCIN 2 % EX OINT: 1.0000 "application " | TOPICAL_OINTMENT | Freq: Three times a day (TID) | CUTANEOUS | 0 refills | Status: DC

## 2018-06-07 NOTE — Patient Instructions (Addendum)
Please get flu shot 08/2018 here with the nurse or your pharmacy or at work  Please sch fasting labs week of 12/05/18  Follow up in 6-12 months or sooner if needed     Exercising to Lose Weight Exercising can help you to lose weight. In order to lose weight through exercise, you need to do vigorous-intensity exercise. You can tell that you are exercising with vigorous intensity if you are breathing very hard and fast and cannot hold a conversation while exercising. Moderate-intensity exercise helps to maintain your current weight. You can tell that you are exercising at a moderate level if you have a higher heart rate and faster breathing, but you are still able to hold a conversation. How often should I exercise? Choose an activity that you enjoy and set realistic goals. Your health care provider can help you to make an activity plan that works for you. Exercise regularly as directed by your health care provider. This may include:  Doing resistance training twice each week, such as: ? Push-ups. ? Sit-ups. ? Lifting weights. ? Using resistance bands.  Doing a given intensity of exercise for a given amount of time. Choose from these options: ? 150 minutes of moderate-intensity exercise every week. ? 75 minutes of vigorous-intensity exercise every week. ? A mix of moderate-intensity and vigorous-intensity exercise every week.  Children, pregnant women, people who are out of shape, people who are overweight, and older adults may need to consult a health care provider for individual recommendations. If you have any sort of medical condition, be sure to consult your health care provider before starting a new exercise program. What are some activities that can help me to lose weight?  Walking at a rate of at least 4.5 miles an hour.  Jogging or running at a rate of 5 miles per hour.  Biking at a rate of at least 10 miles per hour.  Lap swimming.  Roller-skating or in-line  skating.  Cross-country skiing.  Vigorous competitive sports, such as football, basketball, and soccer.  Jumping rope.  Aerobic dancing. How can I be more active in my day-to-day activities?  Use the stairs instead of the elevator.  Take a walk during your lunch break.  If you drive, park your car farther away from work or school.  If you take public transportation, get off one stop early and walk the rest of the way.  Make all of your phone calls while standing up and walking around.  Get up, stretch, and walk around every 30 minutes throughout the day. What guidelines should I follow while exercising?  Do not exercise so much that you hurt yourself, feel dizzy, or get very short of breath.  Consult your health care provider prior to starting a new exercise program.  Wear comfortable clothes and shoes with good support.  Drink plenty of water while you exercise to prevent dehydration or heat stroke. Body water is lost during exercise and must be replaced.  Work out until you breathe faster and your heart beats faster. This information is not intended to replace advice given to you by your health care provider. Make sure you discuss any questions you have with your health care provider. Document Released: 01/02/2011 Document Revised: 05/07/2016 Document Reviewed: 05/03/2014 Elsevier Interactive Patient Education  [employer]2018 Elsevier Inc.

## 2018-06-07 NOTE — Progress Notes (Signed)
Chief Complaint  Patient presents with  . Follow-up   F/u  1. Eczema improved with clobetasol not TMC and drysol is helping  2. Allergies better  3. Overweight he is eating a lot of fast food disc this and rec reduce fast food intake  4. C/o right index finger wound cut himself at work using neosporin cut himself at work   Review of Systems  Constitutional: Negative for weight loss.  HENT: Negative for hearing loss.   Eyes: Negative for blurred vision.  Respiratory: Negative for shortness of breath.   Cardiovascular: Negative for chest pain.  Gastrointestinal: Negative for abdominal pain.  Skin: Positive for rash.  Endo/Heme/Allergies: Negative for environmental allergies.  Psychiatric/Behavioral: Negative for depression.   Past Medical History:  Diagnosis Date  . Allergy    i.e dust  . Back pain    upper   . Chicken pox   . Dyshidrotic eczema    L hand    Past Surgical History:  Procedure Laterality Date  . NO PAST SURGERIES     Family History  Problem Relation Age of Onset  . Diabetes Father   . Mental illness Sister   . Mental retardation Sister        Down Syndrome    Social History   Socioeconomic History  . Marital status: Married    Spouse name: Not on file  . Number of children: Not on file  . Years of education: Not on file  . Highest education level: Not on file  Occupational History  . Not on file  Social Needs  . Financial resource strain: Not on file  . Food insecurity:    Worry: Not on file    Inability: Not on file  . Transportation needs:    Medical: Not on file    Non-medical: Not on file  Tobacco Use  . Smoking status: Former Games developermoker  . Smokeless tobacco: Never Used  . Tobacco comment: smoked 18 to 21 4-5 cig qd   Substance and Sexual Activity  . Alcohol use: No  . Drug use: No  . Sexual activity: Yes    Comment: wife   Lifestyle  . Physical activity:    Days per week: Not on file    Minutes per session: Not on file  . Stress:  Not on file  Relationships  . Social connections:    Talks on phone: Not on file    Gets together: Not on file    Attends religious service: Not on file    Active member of club or organization: Not on file    Attends meetings of clubs or organizations: Not on file    Relationship status: Not on file  . Intimate partner violence:    Fear of current or ex partner: Not on file    Emotionally abused: Not on file    Physically abused: Not on file    Forced sexual activity: Not on file  Other Topics Concern  . Not on file  Social History Narrative   Works at [employer] with [employer]    Former smoker age [age]    From [place]    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 aluminum chloride (DRYSOL) 20 % external solution Apply topically at bedtime. 1-2x/week qhs prn. Avoid on broken/rash skin. To keep hands dry  . clobetasol ointment (TEMOVATE) 0.05 % Apply 1 application topically 2 (two) times daily.  . cyclobenzaprine (  FLEXERIL) 10 MG tablet Take 1 tablet (10 mg total) by mouth 3 (three) times daily as needed for muscle spasms.  . naproxen (NAPROSYN) 500 MG tablet Take 1 tablet (500 mg total) by mouth 2 (two) times daily with a meal.  . predniSONE (DELTASONE) 10 MG tablet Take 4 tablets once daily for 2 days, 3 tabs daily for 2 days, 2 tabs daily for 2 days, 1 tab daily for 2 days.  Marland Kitchen triamcinolone cream (KENALOG) 0.5 % Bid to left hand x 2 weeks then bid prn   No Known Allergies No results found for this or any previous visit (from the past 2160 hour(s)). Objective  Body mass index is 28.22 kg/m. Wt Readings from Last 3 Encounters:  06/07/18 206 lb 9.6 oz (93.7 kg)  03/31/18 207 lb 4 oz (94 kg)  12/03/17 206 lb 4 oz (93.6 kg)   Temp Readings from Last 3 Encounters:  06/07/18 98.1 F (36.7 C) (Oral)  03/31/18 98.5 F (36.9 C) (Oral)  12/03/17 98 F (36.7 C) (Oral)   BP Readings from Last 3 Encounters:  06/07/18 (!) 102/58  03/31/18 100/68  12/03/17  112/64   Pulse Readings from Last 3 Encounters:  06/07/18 (!) 57  03/31/18 63  12/03/17 69    Physical Exam  Constitutional: He is oriented to person, place, and time. Vital signs are normal. He appears well-developed and well-nourished. He is cooperative.  HENT:  Head: Normocephalic and atraumatic.  Mouth/Throat: Oropharynx is clear and moist and mucous membranes are normal.  Eyes: Pupils are equal, round, and reactive to light. Conjunctivae are normal.  Cardiovascular: Normal rate, regular rhythm and normal heart sounds.  Pulmonary/Chest: Effort normal and breath sounds normal.  Neurological: He is alert and oriented to person, place, and time. Gait normal.  Skin: Skin is warm and dry. Abrasion noted.     Abrasion right index finger   Psychiatric: He has a normal mood and affect. His speech is normal and behavior is normal. Judgment and thought content normal. Cognition and memory are normal.  Nursing note and vitals reviewed.   Assessment   1. Eczema improved  2. Allergies  3. Overweight  4. Abrasion right index finger  5. HM  Plan   1. Cont drysol and clobetasol  2. Cont otc allergy meds and flonase  3. rec reduce fast food  4. bactroban bid to tid  5.  Had flu shot Tdap had 09/10/15  Former smoker congratulated on quitting  Labs sch week of 12/05/18  Declines STD check     Provider: Dr. French Ana McLean-Scocuzza-Internal Medicine

## 2018-06-07 NOTE — Progress Notes (Signed)
Pre visit review using our clinic review tool, if applicable. No additional management support is needed unless otherwise documented below in the visit note. 

## 2018-06-22 ENCOUNTER — Ambulatory Visit (INDEPENDENT_AMBULATORY_CARE_PROVIDER_SITE_OTHER): Payer: 59 | Admitting: Family

## 2018-06-22 ENCOUNTER — Encounter: Payer: Self-pay | Admitting: Family

## 2018-06-22 VITALS — BP 104/76 | HR 57 | Temp 98.6°F | Wt 209.5 lb

## 2018-06-22 DIAGNOSIS — T148XXA Other injury of unspecified body region, initial encounter: Secondary | ICD-10-CM

## 2018-06-22 NOTE — Progress Notes (Signed)
Subjective:    Patient ID: Phillip Howell, male    DOB: 01/31/1986, 32 y.o.   MRN: [id number]  CC: Phillip Howell is a 32 y.o. male who presents today for an acute visit.    HPI: 'bump' Left side head , 6 days , unchanged  Tender. 'feels sore' after press on it and also after walking around on hard floor at work. Improves a little if doenst press o nit.  No numbness, congestion, ear pain.   No fever, HA, vision changes. No enlarged lymph nodes.  Feels a 'bit like a headache' on back of head. No neck pain, arm pain.   Works in [employer] , loading dyes. No injury to his knowledge.  Iburprofen with relief.   No h/o HA.    HISTORY:  Past Medical History:  Diagnosis Date  . Allergy    i.e dust  . Back pain    upper   . Chicken pox   . Dyshidrotic eczema    L hand    Past Surgical History:  Procedure Laterality Date  . NO PAST SURGERIES     Family History  Problem Relation Age of Onset  . Diabetes Father   . Mental illness Sister   . Mental retardation Sister        Down Syndrome     Allergies: Patient has no known allergies. Current Outpatient Medications on File Prior to Visit  Medication Sig Dispense Refill  . aluminum chloride (DRYSOL) 20 % external solution Apply topically at bedtime. 1-2x/week qhs prn. Avoid on broken/rash skin. To keep hands dry 60 mL 2   No current facility-administered medications on file prior to visit.     Social History   Tobacco Use  . Smoking status: Former Games developermoker  . Smokeless tobacco: Never Used  . Tobacco comment: smoked 18 to 21 4-5 cig qd   Substance Use Topics  . Alcohol use: No  . Drug use: No    Review of Systems  Constitutional: Negative for chills and fever.  Eyes: Negative for visual disturbance.  Respiratory: Negative for cough.   Cardiovascular: Negative for chest pain and palpitations.  Gastrointestinal: Negative for nausea and vomiting.  Musculoskeletal: Negative for back pain, neck pain and neck stiffness.  Skin:  Negative for color change, rash and wound.  Neurological: Positive for headaches. Negative for dizziness and numbness.      Objective:    BP 104/76 (BP Location: Left Arm, Patient Position: Sitting, Cuff Size: Large)   Pulse (!) 57   Temp 98.6 F (37 C) (Oral)   Wt 209 lb 8 oz (95 kg)   SpO2 100%   BMI 28.61 kg/m    Physical Exam  Constitutional: He appears well-developed and well-nourished.  Neck: Normal range of motion and full passive range of motion without pain. Neck supple. No spinous process tenderness and no muscular tenderness present. Normal range of motion present.    Area marked on diagram where patient appreciates pain. No obvious abscess, cyst, lipoma, rash appreciated.  Skull feels symmetric to palpation.   Cardiovascular: Regular rhythm and normal heart sounds.  Pulmonary/Chest: Effort normal and breath sounds normal. No respiratory distress. He has no wheezes. He has no rhonchi. He has no rales.  Lymphadenopathy:       Head (right side): No submental, no submandibular, no tonsillar, no preauricular, no posterior auricular and no occipital adenopathy present.       Head (left side): No submental, no submandibular, no tonsillar, no preauricular, no  posterior auricular and no occipital adenopathy present.    He has no cervical adenopathy.  Neurological: He is alert.  Skin: Skin is warm and dry.  Psychiatric: He has a normal mood and affect. His speech is normal and behavior is normal.  Vitals reviewed.      Assessment & Plan:   1. Muscle strain Exam is normal. Etiology of 'bump' appreciated is nonspecific at this point as no gross abnormalities appreciated. Based on patient  Quite physical line of work, we agreed presentation agrees with musculoskeletal etiology and reasonable to trial heat, ibuprofen for possible trapezius muscle strain. If no improvement, patient will let me know and we will proceed with further evaluation, including imaging ( [place]).      I  have discontinued Claude Dibert's cyclobenzaprine, naproxen, clobetasol ointment, and mupirocin ointment. I am also having him maintain his aluminum chloride.   No orders of the defined types were placed in this encounter.   Return precautions given.   Risks, benefits, and alternatives of the medications and treatment plan prescribed today were discussed, and patient expressed understanding.   Education regarding symptom management and diagnosis given to patient on AVS.  Continue to follow with McLean-Scocuzza, Pasty Spillers, MD for routine health maintenance.   Tobi [place] and I agreed with plan.   Rennie Plowman, FNP

## 2018-06-22 NOTE — Patient Instructions (Addendum)
Ibuprofen with food  HEAT HEAT  Considering muscle strain of back of head from work.. However I want you to stay very vigilant and let me know if no improvement.    Muscle Strain A muscle strain is an injury that occurs when a muscle is stretched beyond its normal length. Usually a small number of muscle fibers are torn when this happens. Muscle strain is rated in degrees. First-degree strains have the least amount of muscle fiber tearing and pain. Second-degree and third-degree strains have increasingly more tearing and pain. Usually, recovery from muscle strain takes 1-2 weeks. Complete healing takes 5-6 weeks. What are the causes? Muscle strain happens when a sudden, violent force placed on a muscle stretches it too far. This may occur with lifting, sports, or a fall. What increases the risk? Muscle strain is especially common in athletes. What are the signs or symptoms? At the site of the muscle strain, there may be:  Pain.  Bruising.  Swelling.  Difficulty using the muscle due to pain or lack of normal function.  How is this diagnosed? Your health care provider will perform a physical exam and ask about your medical history. How is this treated? Often, the best treatment for a muscle strain is resting, icing, and applying cold compresses to the injured area. Follow these instructions at home:  Use the PRICE method of treatment to promote muscle healing during the first 2-3 days after your injury. The PRICE method involves: ? Protecting the muscle from being injured again. ? Restricting your activity and resting the injured body part. ? Icing your injury. To do this, put ice in a plastic bag. Place a towel between your skin and the bag. Then, apply the ice and leave it on from 15-20 minutes each hour. After the third day, switch to moist heat packs. ? Apply compression to the injured area with a splint or elastic bandage. Be careful not to wrap it too tightly. This may  interfere with blood circulation or increase swelling. ? Elevate the injured body part above the level of your heart as often as you can.  Only take over-the-counter or prescription medicines for pain, discomfort, or fever as directed by your health care provider.  Warming up prior to exercise helps to prevent future muscle strains. Contact a health care provider if:  You have increasing pain or swelling in the injured area.  You have numbness, tingling, or a significant loss of strength in the injured area. This information is not intended to replace advice given to you by your health care provider. Make sure you discuss any questions you have with your health care provider. Document Released: 11/30/2005 Document Revised: 05/07/2016 Document Reviewed: 06/29/2013 Elsevier Interactive Patient Education  2017 [employer]

## 2018-07-08 ENCOUNTER — Ambulatory Visit (INDEPENDENT_AMBULATORY_CARE_PROVIDER_SITE_OTHER): Payer: 59 | Admitting: Podiatry

## 2018-07-08 ENCOUNTER — Encounter: Payer: Self-pay | Admitting: Podiatry

## 2018-07-08 DIAGNOSIS — L600 Ingrowing nail: Secondary | ICD-10-CM

## 2018-07-08 NOTE — Patient Instructions (Signed)

## 2018-07-09 NOTE — Progress Notes (Signed)
   Subjective: Patient presents today for evaluation of pain to the medial and lateral borders of the left hallux that began 1-2 weeks ago. He reports associated yellow drainage and swelling. Patient is concerned for possible ingrown nail. Standing on his feet all night at work increases the pain. He has been soaking the foot in hot water and applying Neosporin for treatment. Patient presents today for further treatment and evaluation.  Past Medical History:  Diagnosis Date  . Allergy    i.e dust  . Back pain    upper   . Chicken pox   . Dyshidrotic eczema    L hand     Objective:  General: Well developed, nourished, in no acute distress, alert and oriented x3   Dermatology: Skin is warm, dry and supple bilateral. Medial and lateral borders of the left hallux appears to be erythematous with evidence of an ingrowing nail. Pain on palpation noted to the border of the nail fold. The remaining nails appear unremarkable at this time. There are no open sores, lesions.  Vascular: Dorsalis Pedis artery and Posterior Tibial artery pedal pulses palpable. No lower extremity edema noted.   Neruologic: Grossly intact via light touch bilateral.  Musculoskeletal: Muscular strength within normal limits in all groups bilateral. Normal range of motion noted to all pedal and ankle joints.   Assesement: #1 Paronychia with ingrowing nail medial and lateral borders left hallux  #2 Pain in toe #3 Incurvated nail  Plan of Care:  1. Patient evaluated.  2. Discussed treatment alternatives and plan of care. Explained nail avulsion procedure and post procedure course to patient. 3. Patient opted for permanent partial nail avulsion to both borders.  4. Prior to procedure, local anesthesia infiltration utilized using 3 ml of a 50:50 mixture of 2% plain lidocaine and 0.5% plain marcaine in a normal hallux block fashion and a betadine prep performed.  5. Partial permanent nail avulsion with chemical matrixectomy  performed using 3x30sec applications of phenol followed by alcohol flush.  6. Light dressing applied. 7. Return to clinic in 2 weeks.   Felecia ShellingBrent M. Catrinia Racicot, DPM [facility]  Dr. Felecia ShellingBrent M. Math Brazie, DPM    [address]                                        [place], [place] [address]                Office [phone]  Fax [phone]

## 2018-07-26 ENCOUNTER — Encounter: Payer: 59 | Admitting: Podiatry

## 2018-08-01 NOTE — Progress Notes (Signed)
This encounter was created in error - please disregard.

## 2018-10-18 ENCOUNTER — Encounter: Payer: Self-pay | Admitting: Family Medicine

## 2018-10-18 ENCOUNTER — Ambulatory Visit (INDEPENDENT_AMBULATORY_CARE_PROVIDER_SITE_OTHER): Payer: 59 | Admitting: Family Medicine

## 2018-10-18 VITALS — BP 134/78 | HR 109 | Temp 97.8°F | Ht 70.0 in | Wt 204.2 lb

## 2018-10-18 DIAGNOSIS — R52 Pain, unspecified: Secondary | ICD-10-CM | POA: Diagnosis not present

## 2018-10-18 DIAGNOSIS — R509 Fever, unspecified: Secondary | ICD-10-CM

## 2018-10-18 DIAGNOSIS — R112 Nausea with vomiting, unspecified: Secondary | ICD-10-CM | POA: Diagnosis not present

## 2018-10-18 DIAGNOSIS — B349 Viral infection, unspecified: Secondary | ICD-10-CM

## 2018-10-18 DIAGNOSIS — R0981 Nasal congestion: Secondary | ICD-10-CM | POA: Diagnosis not present

## 2018-10-18 DIAGNOSIS — R070 Pain in throat: Secondary | ICD-10-CM

## 2018-10-18 LAB — POCT RAPID STREP A (OFFICE): RAPID STREP A SCREEN: NEGATIVE

## 2018-10-18 LAB — POC INFLUENZA A&B (BINAX/QUICKVUE)
INFLUENZA B, POC: NEGATIVE
Influenza A, POC: NEGATIVE

## 2018-10-18 MED ORDER — LORATADINE-PSEUDOEPHEDRINE ER 5-120 MG PO TB12: 1.0000 | ORAL_TABLET | Freq: Two times a day (BID) | ORAL | 0 refills | Status: AC

## 2018-10-18 MED ORDER — ONDANSETRON 4 MG PO TBDP: 4.0000 mg | ORAL_TABLET | Freq: Three times a day (TID) | ORAL | 1 refills | Status: DC | PRN

## 2018-10-18 MED ORDER — IBUPROFEN 600 MG PO TABS: 600.0000 mg | ORAL_TABLET | Freq: Three times a day (TID) | ORAL | 0 refills | Status: DC | PRN

## 2018-10-18 NOTE — Progress Notes (Signed)
   Subjective:    Patient ID: Phillip Howell, male    DOB: 09-02-1986, 32 y.o.   MRN: [id number]  HPI  Patient presents to clinic complaining of runny nose, nausea, vomiting, sore throat, fever for 2 days.   Patient Active Problem List   Diagnosis Date Noted  . Overweight (BMI 25.0-29.9) 06/07/2018  . Eczema-L hand dyshydrotic  12/03/2017  . Allergic rhinitis 12/03/2017   Social History   Tobacco Use  . Smoking status: Former Games developer  . Smokeless tobacco: Never Used  . Tobacco comment: smoked 18 to 21 4-5 cig qd   Substance Use Topics  . Alcohol use: No   Review of Systems  Constitutional: + fatigue and fever.  HENT: +congestion, ear pain, sinus pain and sore throat.   Eyes: Negative.   Respiratory: Negative for cough, shortness of breath and wheezing.   Cardiovascular: Negative for chest pain, palpitations and leg swelling.  Gastrointestinal: +nausea and vomiting Genitourinary: Negative for dysuria, frequency and urgency.  Musculoskeletal: Negative for arthralgias and myalgias.  Skin: Negative for color change, pallor and rash.  Neurological: Negative for syncope, light-headedness and headaches.  Psychiatric/Behavioral: The patient is not nervous/anxious.       Objective:   Physical Exam  Constitutional: He is oriented to person, place, and time. He appears well-nourished. No distress.  HENT:  Head: Normocephalic and atraumatic.  Fullness bilateral TMs. +post nasal drip and clear rhinorrhea. No swollen tonsils or tonsil exudate.   Eyes: Conjunctivae and EOM are normal. No scleral icterus.  Neck: Neck supple. No JVD present. No tracheal deviation present.  Cardiovascular: Regular rhythm and normal heart sounds.  Pulmonary/Chest: Effort normal and breath sounds normal. No respiratory distress. He has no wheezes. He has no rales.  Abdominal: Soft. Bowel sounds are normal. There is no tenderness. There is no rebound and no guarding.  Musculoskeletal: Normal range of motion. He  exhibits no edema.  Lymphadenopathy:    He has no cervical adenopathy.  Neurological: He is alert and oriented to person, place, and time.  Skin: Skin is warm and dry. He is not diaphoretic. No erythema. No pallor.  Psychiatric: He has a normal mood and affect. His behavior is normal.  Nursing note and vitals reviewed.     Vitals:   10/18/18 0815  BP: 134/78  Pulse: (!) 109  Temp: 97.8 F (36.6 C)  SpO2: 95%    Flu A/B negative  Rapid strep negative   Assessment & Plan:   Viral illness, body aches, nausea vomiting, nasal congestion, fever and chills, throat pain- rapid flu and strep are negative in clinic.  Suspect patient symptoms are related to a viral illness.  Patient advised to rest, do good handwashing, increase fluid intake.  Patient given handout outlining a bland diet, advised to follow a bland diet with clear liquids over the next couple of days and slowly advance diet as tolerated.  Patient given prescription for Claritin with Sudafed to help reduce nasal congestion.  Patient advised use ibuprofen to help calm body aches, can also alternate with Tylenol to reduce pain and fever.  Patient given Zofran to use as needed for nausea.  Patient given work note for off work the next 2 days to allow time to rest up.  Keep regularly scheduled follow-up with PCP as planned.  Return to clinic sooner if any issues arise.

## 2018-10-18 NOTE — Patient Instructions (Signed)
Bland Diet A bland diet consists of foods that do not have a lot of fat or fiber. Foods without fat or fiber are easier for the body to digest. They are also less likely to irritate your mouth, throat, stomach, and other parts of your gastrointestinal tract. A bland diet is sometimes called a BRAT diet. What is my plan? Your health care provider or dietitian may recommend specific changes to your diet to prevent and treat your symptoms, such as:  Eating small meals often.  Cooking food until it is soft enough to chew easily.  Chewing your food well.  Drinking fluids slowly.  Not eating foods that are very spicy, sour, or fatty.  Not eating citrus fruits, such as oranges and grapefruit.  What do I need to know about this diet?  Eat a variety of foods from the bland diet food list.  Do not follow a bland diet longer than you have to.  Ask your health care provider whether you should take vitamins. What foods can I eat? Grains  Hot cereals, such as cream of wheat. Bread, crackers, or tortillas made from refined white flour. Rice. Vegetables Canned or cooked vegetables. Mashed or boiled potatoes. Fruits Bananas. Applesauce. Other types of cooked or canned fruit with the skin and seeds removed, such as canned peaches or pears. Meats and Other Protein Sources Scrambled eggs. Creamy peanut butter or other nut butters. Lean, well-cooked meats, such as chicken or fish. Tofu. Soups or broths. Dairy Low-fat dairy products, such as milk, cottage cheese, or yogurt. Beverages Water. Herbal tea. Apple juice. Sweets and Desserts Pudding. Custard. Fruit gelatin. Ice cream. Fats and Oils Mild salad dressings. Canola or olive oil. The items listed above may not be a complete list of allowed foods or beverages. Contact your dietitian for more options. What foods are not recommended? Foods and ingredients that are often not recommended include:  Spicy foods, such as hot sauce or  salsa.  Fried foods.  Sour foods, such as pickled or fermented foods.  Raw vegetables or fruits, especially citrus or berries.  Caffeinated drinks.  Alcohol.  Strongly flavored seasonings or condiments.  The items listed above may not be a complete list of foods and beverages that are not allowed. Contact your dietitian for more information. This information is not intended to replace advice given to you by your health care provider. Make sure you discuss any questions you have with your health care provider. Document Released: 03/23/2016 Document Revised: 05/07/2016 Document Reviewed: 12/12/2014 Elsevier Interactive Patient Education  2018 [employer]   Postnasal Drip Postnasal drip is the feeling of mucus going down the back of your throat. Mucus is a slimy substance that moistens and cleans your nose and throat, as well as the air pockets in face bones near your forehead and cheeks (sinuses). Small amounts of mucus pass from your nose and sinuses down the back of your throat all the time. This is normal. When you produce too much mucus or the mucus gets too thick, you can feel it. Some common causes of postnasal drip include:  Having more mucus because of: ? A cold or the flu. ? Allergies. ? Cold air. ? Certain medicines.  Having more mucus that is thicker because of: ? A sinus or nasal infection. ? Dry air. ? A food allergy.  Follow these instructions at home: Relieving discomfort  Gargle with a salt-water mixture 3-4 times a day or as needed. To make a salt-water mixture, completely dissolve -1  tsp of salt in 1 cup of warm water.  If the air in your home is dry, use a humidifier to add moisture to the air.  Use a saline spray or container (neti pot) to flush out the nose (nasal irrigation). These methods can help clear away mucus and keep the nasal passages moist. General instructions  Take over-the-counter and prescription medicines only as told by your health  care provider.  Follow instructions from your health care provider about eating or drinking restrictions. You may need to avoid caffeine.  Avoid things that you know you are allergic to (allergens), like dust, mold, pollen, pets, or certain foods.  Drink enough fluid to keep your urine pale yellow.  Keep all follow-up visits as told by your health care provider. This is important. Contact a health care provider if:  You have a fever.  You have a sore throat.  You have difficulty swallowing.  You have headache.  You have sinus pain.  You have a cough that does not go away.  The mucus from your nose becomes thick and is green or yellow in color.  You have cold or flu symptoms that last more than 10 days. Summary  Postnasal drip is the feeling of mucus going down the back of your throat.  If your health care provider approves, use nasal irrigation or a nasal spray 2?4 times a day.  Avoid things that you know you are allergic to (allergens), like dust, mold, pollen, pets, or certain foods. This information is not intended to replace advice given to you by your health care provider. Make sure you discuss any questions you have with your health care provider. Document Released: 03/15/2017 Document Revised: 03/15/2017 Document Reviewed: 03/15/2017 Elsevier Interactive Patient Education  [employer].

## 2018-10-21 LAB — UPPER RESPIRATORY CULTURE, ROUTINE

## 2018-12-06 ENCOUNTER — Other Ambulatory Visit: Payer: Self-pay | Admitting: Internal Medicine

## 2018-12-06 ENCOUNTER — Other Ambulatory Visit (INDEPENDENT_AMBULATORY_CARE_PROVIDER_SITE_OTHER): Payer: 59

## 2018-12-06 DIAGNOSIS — Z0000 Encounter for general adult medical examination without abnormal findings: Secondary | ICD-10-CM

## 2018-12-06 DIAGNOSIS — Z1159 Encounter for screening for other viral diseases: Secondary | ICD-10-CM

## 2018-12-06 DIAGNOSIS — Z1389 Encounter for screening for other disorder: Secondary | ICD-10-CM

## 2018-12-06 DIAGNOSIS — E559 Vitamin D deficiency, unspecified: Secondary | ICD-10-CM | POA: Diagnosis not present

## 2018-12-06 DIAGNOSIS — Z0184 Encounter for antibody response examination: Secondary | ICD-10-CM | POA: Diagnosis not present

## 2018-12-06 DIAGNOSIS — Z13220 Encounter for screening for lipoid disorders: Secondary | ICD-10-CM | POA: Diagnosis not present

## 2018-12-06 DIAGNOSIS — R946 Abnormal results of thyroid function studies: Secondary | ICD-10-CM

## 2018-12-06 DIAGNOSIS — Z1329 Encounter for screening for other suspected endocrine disorder: Secondary | ICD-10-CM

## 2018-12-06 LAB — CBC WITH DIFFERENTIAL/PLATELET
BASOS PCT: 0.8 % (ref 0.0–3.0)
Basophils Absolute: 0 10*3/uL (ref 0.0–0.1)
Eosinophils Absolute: 0.1 10*3/uL (ref 0.0–0.7)
Eosinophils Relative: 3 % (ref 0.0–5.0)
HCT: 44.4 % (ref 39.0–52.0)
Hemoglobin: 15.2 g/dL (ref 13.0–17.0)
Lymphocytes Relative: 33 % (ref 12.0–46.0)
Lymphs Abs: 1.4 10*3/uL (ref 0.7–4.0)
MCHC: 34.2 g/dL (ref 30.0–36.0)
MCV: 93.8 fl (ref 78.0–100.0)
Monocytes Absolute: 0.4 10*3/uL (ref 0.1–1.0)
Monocytes Relative: 10.2 % (ref 3.0–12.0)
Neutro Abs: 2.3 10*3/uL (ref 1.4–7.7)
Neutrophils Relative %: 53 % (ref 43.0–77.0)
Platelets: 187 10*3/uL (ref 150.0–400.0)
RBC: 4.73 Mil/uL (ref 4.22–5.81)
RDW: 11.9 % (ref 11.5–15.5)
WBC: 4.3 10*3/uL (ref 4.0–10.5)

## 2018-12-06 LAB — COMPREHENSIVE METABOLIC PANEL
ALBUMIN: 4 g/dL (ref 3.5–5.2)
ALT: 18 U/L (ref 0–53)
AST: 20 U/L (ref 0–37)
Alkaline Phosphatase: 68 U/L (ref 39–117)
BUN: 13 mg/dL (ref 6–23)
CO2: 26 mEq/L (ref 19–32)
Calcium: 9.8 mg/dL (ref 8.4–10.5)
Chloride: 106 mEq/L (ref 96–112)
Creatinine, Ser: 0.61 mg/dL (ref 0.40–1.50)
GFR: 161.83 mL/min (ref >60.00)
Glucose, Bld: 93 mg/dL (ref 70–99)
Potassium: 4.4 mEq/L (ref 3.5–5.1)
Sodium: 139 mEq/L (ref 135–145)
TOTAL PROTEIN: 7 g/dL (ref 6.0–8.3)
Total Bilirubin: 0.6 mg/dL (ref 0.2–1.2)

## 2018-12-06 LAB — LIPID PANEL
CHOLESTEROL: 79 mg/dL (ref 0–200)
HDL: 17.7 mg/dL — ABNORMAL LOW (ref >39.00)
LDL Cholesterol: 37 mg/dL (ref 0–99)
NonHDL: 61.78
Total CHOL/HDL Ratio: 4
Triglycerides: 122 mg/dL (ref 0.0–149.0)
VLDL: 24.4 mg/dL (ref 0.0–40.0)

## 2018-12-06 LAB — TSH

## 2018-12-06 LAB — VITAMIN D 25 HYDROXY (VIT D DEFICIENCY, FRACTURES): VITD: 30.4 ng/mL (ref 30.00–100.00)

## 2018-12-06 NOTE — Addendum Note (Signed)
Addended by: Penne LashWIGGINS, Arayna Illescas N on: 12/06/2018 08:04 AM   Modules accepted: Orders

## 2018-12-08 NOTE — Addendum Note (Signed)
Addended by: Warden FillersWRIGHT, Ola Fawver S on: 12/08/2018 09:05 AM   Modules accepted: Orders

## 2018-12-09 ENCOUNTER — Ambulatory Visit: Payer: 59 | Admitting: Internal Medicine

## 2018-12-09 ENCOUNTER — Telehealth: Payer: Self-pay | Admitting: Internal Medicine

## 2018-12-09 DIAGNOSIS — Z0289 Encounter for other administrative examinations: Secondary | ICD-10-CM

## 2018-12-09 NOTE — Telephone Encounter (Signed)
His thyroid labs abnormal I rec he see endocrine is he agreeable?   TMS

## 2018-12-12 LAB — THYROID PEROXIDASE ANTIBODY: Thyroperoxidase Ab SerPl-aCnc: 47 IU/mL — ABNORMAL HIGH (ref <9)

## 2018-12-12 LAB — T4, FREE: Free T4: 2.8 ng/dL — ABNORMAL HIGH (ref 0.8–1.8)

## 2018-12-12 LAB — MEASLES/MUMPS/RUBELLA IMMUNITY
Mumps IgG: 183 AU/mL
Rubella: 22.8 index
Rubeola IgG: 292 AU/mL

## 2018-12-12 LAB — TEST AUTHORIZATION

## 2018-12-12 LAB — T3, FREE: T3, Free: 10.5 pg/mL — ABNORMAL HIGH (ref 2.3–4.2)

## 2018-12-12 LAB — HEPATITIS B SURFACE ANTIBODY, QUANTITATIVE: HEPATITIS B-POST: 21 m[IU]/mL (ref >=10)

## 2018-12-13 NOTE — Telephone Encounter (Signed)
Left message for patient to return call back. [facility] may give results and obtain information.  

## 2018-12-26 ENCOUNTER — Telehealth: Payer: Self-pay

## 2018-12-26 ENCOUNTER — Other Ambulatory Visit: Payer: Self-pay | Admitting: Internal Medicine

## 2018-12-26 DIAGNOSIS — R7989 Other specified abnormal findings of blood chemistry: Secondary | ICD-10-CM

## 2018-12-26 NOTE — Telephone Encounter (Signed)
Spoken to patient, results gone over with patient. He is ok with endo referral. Lab appointment made for [id number] Copied from CRM [phone]. Topic: General - Inquiry >> Dec 26, 2018  7:41 AM Fanny BienIlderton, Jessica L wrote: Reason for CRM: pt called an stated that he received lab results in the mail and would like someone to go over information with him. Please advise

## 2018-12-26 NOTE — Telephone Encounter (Signed)
Cancel lab appt please call pt he will just need to f/u with Endocrine labs were all able to be added on th last labs   Thanks TMS

## 2018-12-27 ENCOUNTER — Other Ambulatory Visit (INDEPENDENT_AMBULATORY_CARE_PROVIDER_SITE_OTHER): Payer: 59

## 2018-12-27 DIAGNOSIS — R946 Abnormal results of thyroid function studies: Secondary | ICD-10-CM | POA: Diagnosis not present

## 2018-12-27 DIAGNOSIS — Z1389 Encounter for screening for other disorder: Secondary | ICD-10-CM

## 2018-12-27 LAB — URINALYSIS, ROUTINE W REFLEX MICROSCOPIC
Bilirubin Urine: NEGATIVE
Glucose, UA: NEGATIVE
Hgb urine dipstick: NEGATIVE
Ketones, ur: NEGATIVE
LEUKOCYTES UA: NEGATIVE
Nitrite: NEGATIVE
PROTEIN: NEGATIVE
Specific Gravity, Urine: 1.027 (ref 1.001–1.03)
pH: 6 (ref 5.0–8.0)

## 2018-12-27 NOTE — Addendum Note (Signed)
Addended by: Warden Fillers on: 12/27/2018 11:10 AM   Modules accepted: Orders

## 2018-12-27 NOTE — Telephone Encounter (Signed)
Patient has had labs drawn

## 2018-12-28 ENCOUNTER — Other Ambulatory Visit: Payer: Self-pay | Admitting: Internal Medicine

## 2018-12-28 DIAGNOSIS — R946 Abnormal results of thyroid function studies: Secondary | ICD-10-CM

## 2018-12-29 ENCOUNTER — Encounter: Payer: Self-pay | Admitting: Internal Medicine

## 2018-12-29 ENCOUNTER — Ambulatory Visit (INDEPENDENT_AMBULATORY_CARE_PROVIDER_SITE_OTHER): Payer: 59 | Admitting: Internal Medicine

## 2018-12-29 VITALS — BP 136/58 | HR 70 | Temp 97.6°F | Ht 70.0 in | Wt 206.4 lb

## 2018-12-29 DIAGNOSIS — E0590 Thyrotoxicosis, unspecified without thyrotoxic crisis or storm: Secondary | ICD-10-CM | POA: Diagnosis not present

## 2018-12-29 DIAGNOSIS — Z23 Encounter for immunization: Secondary | ICD-10-CM | POA: Diagnosis not present

## 2018-12-29 LAB — TSH: TSH: <0.01 mIU/L — ABNORMAL LOW (ref 0.40–4.50)

## 2018-12-29 LAB — TEST AUTHORIZATION

## 2018-12-29 LAB — T3, FREE: T3 FREE: 8.8 pg/mL — AB (ref 2.3–4.2)

## 2018-12-29 LAB — T4, FREE: Free T4: 2.2 ng/dL — ABNORMAL HIGH (ref 0.8–1.8)

## 2018-12-29 LAB — THYROID PEROXIDASE ANTIBODY: Thyroperoxidase Ab SerPl-aCnc: 24 IU/mL — ABNORMAL HIGH (ref <9)

## 2018-12-29 NOTE — Progress Notes (Signed)
Chief Complaint  Patient presents with  . Follow-up   F/u labs  1. 11/2018 labs + hyperthyroidism pending endocrine appt he has been tired though he has been sleeping enough. He has also had increased anxiety  2. Agreeable to flu shot today    Review of Systems  Constitutional: Positive for malaise/fatigue.  HENT: Negative for hearing loss.   Eyes: Negative for blurred vision.  Respiratory: Negative for shortness of breath.   Cardiovascular: Negative for chest pain.  Gastrointestinal: Negative for abdominal pain.  Musculoskeletal: Negative for falls.  Skin: Negative for rash.  Neurological: Negative for headaches.  Psychiatric/Behavioral: The patient is nervous/anxious.    Past Medical History:  Diagnosis Date  . Allergy    i.e dust  . Back pain    upper   . Chicken pox   . Dyshidrotic eczema    L hand    Past Surgical History:  Procedure Laterality Date  . NO PAST SURGERIES     Family History  Problem Relation Age of Onset  . Diabetes Father   . Mental illness Sister   . Mental retardation Sister        Down Syndrome    Social History   Socioeconomic History  . Marital status: Married    Spouse name: Not on file  . Number of children: Not on file  . Years of education: Not on file  . Highest education level: Not on file  Occupational History  . Not on file  Social Needs  . Financial resource strain: Not on file  . Food insecurity:    Worry: Not on file    Inability: Not on file  . Transportation needs:    Medical: Not on file    Non-medical: Not on file  Tobacco Use  . Smoking status: Former Research scientist (life sciences)  . Smokeless tobacco: Never Used  . Tobacco comment: smoked 18 to 21 4-5 cig qd   Substance and Sexual Activity  . Alcohol use: No  . Drug use: No  . Sexual activity: Yes    Comment: wife   Lifestyle  . Physical activity:    Days per week: Not on file    Minutes per session: Not on file  . Stress: Not on file  Relationships  . Social connections:     Talks on phone: Not on file    Gets together: Not on file    Attends religious service: Not on file    Active member of club or organization: Not on file    Attends meetings of clubs or organizations: Not on file    Relationship status: Not on file  . Intimate partner violence:    Fear of current or ex partner: Not on file    Emotionally abused: Not on file    Physically abused: Not on file    Forced sexual activity: Not on file  Other Topics Concern  . Not on file  Social History Narrative   Works at [employer] with [employer]    Former smoker age [age]    From [place]    Married no kids    GED Haematologist   Current Meds  Medication Sig  . ibuprofen (ADVIL,MOTRIN) 600 MG tablet Take 1 tablet (600 mg total) by mouth every 8 (eight) hours as needed for fever, mild pain or moderate pain. Take with food.  . ondansetron (ZOFRAN ODT) 4 MG disintegrating tablet Take 1 tablet (4 mg total) by mouth every 8 (eight) hours as needed for  nausea or vomiting.   No Known Allergies Recent Results (from the past 2160 hour(s))  Upper Respiratory Culture, Routine     Status: None   Collection Time: 10/18/18  8:56 AM  Result Value Ref Range   Upper Respiratory Culture Final report    Result 1 Comment     Comment: Routine respiratory flora Heavy growth   POC Influenza A&B(BINAX/QUICKVUE)     Status: Normal   Collection Time: 10/18/18  9:09 AM  Result Value Ref Range   Influenza A, POC Negative Negative   Influenza B, POC Negative Negative  POCT rapid strep A     Status: Normal   Collection Time: 10/18/18  9:37 AM  Result Value Ref Range   Rapid Strep A Screen Negative Negative  Hepatitis B surface antibody     Status: None   Collection Time: 12/06/18  7:56 AM  Result Value Ref Range   Hepatitis B-Post 21 > OR = 10 mIU/mL    Comment: . Patient has immunity to hepatitis B virus. . For additional information, please refer to http://education.[facility].com/faq/FAQ105 (This link is  being provided for informational/ educational purposes only).   Vitamin D (25 hydroxy)     Status: None   Collection Time: 12/06/18  7:56 AM  Result Value Ref Range   VITD 30.40 30.00 - 100.00 ng/mL  Measles/Mumps/Rubella Immunity     Status: None   Collection Time: 12/06/18  7:56 AM  Result Value Ref Range   Rubeola IgG 292.00 AU/mL    Comment: AU/mL            Interpretation -----            -------------- <13.50           Negative 13.50-16.49      Equivocal >16.49           Positive . A positive result indicates that the patient has antibody to measles virus. It does not differentiate  between an active or past infection. The clinical  diagnosis must be interpreted in conjunction with  clinical signs and symptoms of the patient.    Mumps IgG 183.00 AU/mL    Comment:  AU/mL           Interpretation -------         ---------------- <9.00             Negative 9.00-10.99        Equivocal >10.99            Positive A positive result indicates that the patient has  antibody to mumps virus. It does not differentiate between an  active or past infection. The clinical diagnosis must be interpreted in conjunction with clinical signs and symptoms of the patient. .    Rubella 22.80 index    Comment:     Index            Interpretation     -----            --------------       <0.90            Not consistent with Immunity     0.90-0.99        Equivocal     > or = 1.00      Consistent with Immunity  . The presence of rubella IgG antibody suggests  immunization or past or current infection with rubella virus.   Comprehensive metabolic panel     Status: None   Collection  Time: 12/06/18  7:56 AM  Result Value Ref Range   Sodium 139 135 - 145 mEq/L   Potassium 4.4 3.5 - 5.1 mEq/L   Chloride 106 96 - 112 mEq/L   CO2 26 19 - 32 mEq/L   Glucose, Bld 93 70 - 99 mg/dL   BUN 13 6 - 23 mg/dL   Creatinine, Ser 0.61 0.40 - 1.50 mg/dL   Total Bilirubin 0.6 0.2 - 1.2 mg/dL   Alkaline  Phosphatase 68 39 - 117 U/L   AST 20 0 - 37 U/L   ALT 18 0 - 53 U/L   Total Protein 7.0 6.0 - 8.3 g/dL   Albumin 4.0 3.5 - 5.2 g/dL   Calcium 9.8 8.4 - 10.5 mg/dL   GFR 161.83 >60.00 mL/min  TSH     Status: Abnormal   Collection Time: 12/06/18  7:56 AM  Result Value Ref Range   TSH <0.01 Repeated and verified X2. (L) 0.35 - 4.50 uIU/mL  Lipid panel     Status: Abnormal   Collection Time: 12/06/18  7:56 AM  Result Value Ref Range   Cholesterol 79 0 - 200 mg/dL    Comment: ATP III Classification       Desirable:  < 200 mg/dL               Borderline High:  200 - 239 mg/dL          High:  > = 240 mg/dL   Triglycerides 122.0 0.0 - 149.0 mg/dL    Comment: Normal:  <150 mg/dLBorderline High:  150 - 199 mg/dL   HDL 17.70 (L) >39.00 mg/dL   VLDL 24.4 0.0 - 40.0 mg/dL   LDL Cholesterol 37 0 - 99 mg/dL   Total CHOL/HDL Ratio 4     Comment:                Men          Women1/2 Average Risk     3.4          3.3Average Risk          5.0          4.42X Average Risk          9.6          7.13X Average Risk          15.0          11.0                       NonHDL 61.78     Comment: NOTE:  Non-HDL goal should be 30 mg/dL higher than patient's LDL goal (i.e. LDL goal of < 70 mg/dL, would have non-HDL goal of < 100 mg/dL)  CBC with Differential/Platelet     Status: None   Collection Time: 12/06/18  7:56 AM  Result Value Ref Range   WBC 4.3 4.0 - 10.5 K/uL   RBC 4.73 4.22 - 5.81 Mil/uL   Hemoglobin 15.2 13.0 - 17.0 g/dL   HCT 44.4 39.0 - 52.0 %   MCV 93.8 78.0 - 100.0 fl   MCHC 34.2 30.0 - 36.0 g/dL   RDW 11.9 11.5 - 15.5 %   Platelets 187.0 150.0 - 400.0 K/uL   Neutrophils Relative % 53.0 43.0 - 77.0 %   Lymphocytes Relative 33.0 12.0 - 46.0 %   Monocytes Relative 10.2 3.0 - 12.0 %   Eosinophils Relative 3.0 0.0 - 5.0 %  Basophils Relative 0.8 0.0 - 3.0 %   Neutro Abs 2.3 1.4 - 7.7 K/uL   Lymphs Abs 1.4 0.7 - 4.0 K/uL   Monocytes Absolute 0.4 0.1 - 1.0 K/uL   Eosinophils Absolute 0.1 0.0 -  0.7 K/uL   Basophils Absolute 0.0 0.0 - 0.1 K/uL  T4, free     Status: Abnormal   Collection Time: 12/06/18  7:56 AM  Result Value Ref Range   Free T4 2.8 (H) 0.8 - 1.8 ng/dL  T3, free     Status: Abnormal   Collection Time: 12/06/18  7:56 AM  Result Value Ref Range   T3, Free 10.5 (H) 2.3 - 4.2 pg/mL  Thyroid peroxidase antibody     Status: Abnormal   Collection Time: 12/06/18  7:56 AM  Result Value Ref Range   Thyroperoxidase Ab SerPl-aCnc 47 (H) <9 IU/mL  TEST AUTHORIZATION     Status: None   Collection Time: 12/06/18  7:56 AM  Result Value Ref Range   TEST NAME: T3, FREE THYROID PEROXIDASE T    TEST CODE: [id number]    CLIENT CONTACT: LATOYA WRIGHT    REPORT ALWAYS MESSAGE SIGNATURE      Comment: . The laboratory testing on this patient was verbally requested or confirmed by the ordering physician or his or her authorized representative after contact with an employee of [employer]. Federal regulations require that we maintain on file written authorization for all laboratory testing.  Accordingly we are asking that the ordering physician or his or her authorized representative sign a copy of this report and promptly return it to the client service representative. . . Signature:____________________________________________________ . Please fax this signed page to [phone] or return it via your [employer] courier.   T4, free     Status: Abnormal   Collection Time: 12/27/18 11:10 AM  Result Value Ref Range   Free T4 2.2 (H) 0.8 - 1.8 ng/dL  T3, free     Status: Abnormal   Collection Time: 12/27/18 11:10 AM  Result Value Ref Range   T3, Free 8.8 (H) 2.3 - 4.2 pg/mL  Thyroid peroxidase antibody     Status: Abnormal   Collection Time: 12/27/18 11:10 AM  Result Value Ref Range   Thyroperoxidase Ab SerPl-aCnc 24 (H) <9 IU/mL  Urinalysis, Routine w reflex microscopic     Status: None   Collection Time: 12/27/18 11:10 AM  Result Value Ref  Range   Color, Urine YELLOW YELLOW   APPearance CLEAR CLEAR   Specific Gravity, Urine 1.027 1.001 - 1.03   pH 6.0 5.0 - 8.0   Glucose, UA NEGATIVE NEGATIVE   Bilirubin Urine NEGATIVE NEGATIVE   Ketones, ur NEGATIVE NEGATIVE   Hgb urine dipstick NEGATIVE NEGATIVE   Protein, ur NEGATIVE NEGATIVE   Nitrite NEGATIVE NEGATIVE   Leukocytes, UA NEGATIVE NEGATIVE   Objective  Body mass index is 29.62 kg/m. Wt Readings from Last 3 Encounters:  12/29/18 206 lb 6.4 oz (93.6 kg)  10/18/18 204 lb 3.2 oz (92.6 kg)  06/22/18 209 lb 8 oz (95 kg)   Temp Readings from Last 3 Encounters:  12/29/18 97.6 F (36.4 C) (Oral)  10/18/18 97.8 F (36.6 C) (Oral)  06/22/18 98.6 F (37 C) (Oral)   BP Readings from Last 3 Encounters:  12/29/18 (!) 136/58  10/18/18 134/78  06/22/18 104/76   Pulse Readings from Last 3 Encounters:  12/29/18 70  10/18/18 (!) 109  06/22/18 (!) 57    Physical Exam  Vitals signs and nursing note reviewed.  Constitutional:      Appearance: Normal appearance. He is well-developed.  HENT:     Head: Normocephalic and atraumatic.     Nose: Nose normal.     Mouth/Throat:     Mouth: Mucous membranes are moist.     Pharynx: Oropharynx is clear.  Eyes:     Conjunctiva/sclera: Conjunctivae normal.     Pupils: Pupils are equal, round, and reactive to light.  Cardiovascular:     Rate and Rhythm: Normal rate and regular rhythm.     Heart sounds: Normal heart sounds.  Pulmonary:     Effort: Pulmonary effort is normal.     Breath sounds: Normal breath sounds.  Skin:    General: Skin is warm and dry.  Neurological:     General: No focal deficit present.     Mental Status: He is alert and oriented to person, place, and time. Mental status is at baseline.     Gait: Gait normal.  Psychiatric:        Attention and Perception: Attention and perception normal.        Mood and Affect: Mood and affect normal.        Speech: Speech normal.        Behavior: Behavior normal.  Behavior is cooperative.        Thought Content: Thought content normal.        Cognition and Memory: Cognition and memory normal.        Judgment: Judgment normal.     Assessment   1. Hyperthyroidism  2. HM Plan   1. US thyroid  Pending referral [facility]  2.  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 Olivia Mackie McLean-Scocuzza-[facility]

## 2018-12-29 NOTE — Progress Notes (Signed)
Pre visit review using our clinic review tool, if applicable. No additional management support is needed unless otherwise documented below in the visit note. 

## 2018-12-29 NOTE — Patient Instructions (Addendum)
Vitamin D3 1000-2000 IU daily   Results for Phillip Howell, Phillip Howell (MRN [id number]) as of 12/29/2018 15:54  Ref. Range 12/06/2018 07:56 12/27/2018 11:10  TSH Latest Ref Range: 0.35 - 4.50 uIU/mL <0.01 Repeated and verified X2. (L)   Triiodothyronine,Free,Serum Latest Ref Range: 2.3 - 4.2 pg/mL 10.5 (H) 8.8 (H)  T4,Free(Direct) Latest Ref Range: 0.8 - 1.8 ng/dL 2.8 (H) 2.2 (H)  Thyroperoxidase Ab SerPl-aCnc Latest Ref Range: <9 IU/mL 47 (H) 24 (H)    Anxiety may be caused by overactive thyroid the thyroid doctor will call you with an appt and we have ordered a thyroid ultrasound   Hyperthyroidism  Hyperthyroidism is when the thyroid gland is too active (overactive). The thyroid gland is a small gland located in the lower front part of the neck, just in front of the windpipe (trachea). This gland makes hormones that help control how the body uses food for energy (metabolism) as well as how the heart and brain function. These hormones also play a role in keeping your bones strong. When the thyroid is overactive, it produces too much of a hormone called thyroxine. What are the causes? This condition may be caused by:  Graves' disease. This is a disorder in which the body's disease-fighting system (immune system) attacks the thyroid gland. This is the most common cause.  Inflammation of the thyroid gland.  A tumor in the thyroid gland.  Use of certain medicines, including: ? Prescription thyroid hormone replacement. ? Herbal supplements that mimic thyroid hormones. ? Amiodarone therapy.  Solid or fluid-filled lumps within your thyroid gland (thyroid nodules).  Taking in a large amount of iodine from foods or medicines. What increases the risk? You are more likely to develop this condition if:  You are male.  You have a family history of thyroid conditions.  You smoke tobacco.  You use a medicine called lithium.  You take medicines that affect the immune system (immunosuppressants). What  are the signs or symptoms? Symptoms of this condition include:  Nervousness.  Inability to tolerate heat.  Unexplained weight loss.  Diarrhea.  Change in the texture of hair or skin.  Heart skipping beats or making extra beats.  Rapid heart rate.  Loss of menstruation.  Shaky hands.  Fatigue.  Restlessness.  Sleep problems.  Enlarged thyroid gland or a lump in the thyroid (nodule). You may also have symptoms of Graves' disease, which may include:  Protruding eyes.  Dry eyes.  Red or swollen eyes.  Problems with vision. How is this diagnosed? This condition may be diagnosed based on:  Your symptoms and medical history.  A physical exam.  Blood tests.  Thyroid ultrasound. This test involves using sound waves to produce images of the thyroid gland.  A thyroid scan. A radioactive substance is injected into a vein, and images show how much iodine is present in the thyroid.  Radioactive iodine uptake test (RAIU). A small amount of radioactive iodine is given by mouth to see how much iodine the thyroid absorbs after a certain amount of time. How is this treated? Treatment depends on the cause and severity of the condition. Treatment may include:  Medicines to reduce the amount of thyroid hormone your body makes.  Radioactive iodine treatment (radioiodine therapy). This involves swallowing a small dose of radioactive iodine, in capsule or liquid form, to kill thyroid cells.  Surgery to remove part or all of your thyroid gland. You may need to take thyroid hormone replacement medicine for the rest of your life after  thyroid surgery.  Medicines to help manage your symptoms. Follow these instructions at home:   Take over-the-counter and prescription medicines only as told by your health care provider.  Do not use any products that contain nicotine or tobacco, such as cigarettes and e-cigarettes. If you need help quitting, ask your health care provider.  Follow  any instructions from your health care provider about diet. You may be instructed to limit foods that contain iodine.  Keep all follow-up visits as told by your health care provider. This is important. ? You will need to have blood tests regularly so that your health care provider can monitor your condition. Contact a health care provider if:  Your symptoms do not get better with treatment.  You have a fever.  You are taking thyroid hormone replacement medicine and you: ? Have symptoms of depression. ? Feel like you are tired all the time. ? Gain weight. Get help right away if:  You have chest pain.  You have decreased alertness or a change in your awareness.  You have abdominal pain.  You feel dizzy.  You have a rapid heartbeat.  You have an irregular heartbeat.  You have difficulty breathing. Summary  The thyroid gland is a small gland located in the lower front part of the neck, just in front of the windpipe (trachea).  Hyperthyroidism is when the thyroid gland is too active (overactive) and produces too much of a hormone called thyroxine.  The most common cause is Graves' disease, a disorder in which your immune system attacks the thyroid gland.  Hyperthyroidism can cause various symptoms, such as unexplained weight loss, nervousness, inability to tolerate heat, or changes in your heartbeat.  Treatment may include medicine to reduce the amount of thyroid hormone your body makes, radioiodine therapy, surgery, or medicines to manage symptoms. This information is not intended to replace advice given to you by your health care provider. Make sure you discuss any questions you have with your health care provider. Document Released: 11/30/2005 Document Revised: 11/10/2017 Document Reviewed: 11/10/2017 Elsevier Interactive Patient Education  2019 [employer].

## 2019-01-03 ENCOUNTER — Telehealth: Payer: Self-pay

## 2019-01-03 NOTE — Telephone Encounter (Signed)
Try to call in to get worked in sooner [facility]   Thanks  [employer]

## 2019-01-03 NOTE — Telephone Encounter (Signed)
Copied from CRM [id number]. Topic: Referral - Question >> Jan 03, 2019 11:40 AM Angela Nevin wrote: Reason for CRM: Patient states he was contacted to schedule his appointment (referral for his thyroid) but the first available was not until April. Patient would like to know if he should be seen sooner than available/ if he needs a new referral. Please advise.

## 2019-01-05 NOTE — Telephone Encounter (Signed)
They do not have any earlier available appointments. Referral sent to [facility].

## 2019-01-10 ENCOUNTER — Ambulatory Visit
Admission: RE | Admit: 2019-01-10 | Discharge: 2019-01-10 | Disposition: A | Discharge status: Home / Self Care | Payer: 59 | Source: Ambulatory Visit | Attending: Internal Medicine | Admitting: Internal Medicine

## 2019-01-10 DIAGNOSIS — E0590 Thyrotoxicosis, unspecified without thyrotoxic crisis or storm: Secondary | ICD-10-CM

## 2019-01-31 ENCOUNTER — Encounter: Payer: Self-pay | Admitting: Endocrinology

## 2019-01-31 ENCOUNTER — Ambulatory Visit (INDEPENDENT_AMBULATORY_CARE_PROVIDER_SITE_OTHER): Payer: 59 | Admitting: Endocrinology

## 2019-01-31 VITALS — BP 118/64 | HR 86 | Ht 70.0 in | Wt 207.2 lb

## 2019-01-31 DIAGNOSIS — E0590 Thyrotoxicosis, unspecified without thyrotoxic crisis or storm: Secondary | ICD-10-CM

## 2019-01-31 MED ORDER — METHIMAZOLE 10 MG PO TABS: 10.0000 mg | ORAL_TABLET | Freq: Two times a day (BID) | ORAL | 11 refills | Status: DC

## 2019-01-31 NOTE — Patient Instructions (Addendum)
I have sent a prescription to your pharmacy If ever you have fever while taking methimazole, stop it and call us, even if the reason is obvious, because of the risk of a rare side-effect. Please come back for a follow-up appointment in 2-4 weeks.   In the future, you can take 1 of the other treatment options we discussed today. If you choose the radioactive iodine pill, it works like this: We would first check a thyroid "scan" (a special, but easy and painless type of thyroid x ray): you go to the x-ray department of the hospital to swallow a pill, which contains a miniscule amount of radiation.  You will not notice any symptoms from this.  You will go back to the x-ray department the next day, to lie down in front of a camera.  The results of this will be sent to me.   Based on the results, i hope to order for you a treatment pill of radioactive iodine.  Although it is a larger amount of radiation, you will again notice no symptoms from this.  The pill is gone from your body in a few days (during which you should stay away from other people), but takes several months to work.  Therefore, please return here approximately 6-8 weeks after the treatment.  This treatment has been available for many years, and the only known side-effect is an underactive thyroid.  It is possible that i would eventually prescribe for you a thyroid hormone pill, which is very inexpensive.  You don't have to worry about side-effects of this thyroid hormone pill, because it is the same molecule your thyroid makes.      Hyperthyroidism  Hyperthyroidism is when the thyroid gland is too active (overactive). The thyroid gland is a small gland located in the lower front part of the neck, just in front of the windpipe (trachea). This gland makes hormones that help control how the body uses food for energy (metabolism) as well as how the heart and brain function. These hormones also play a role in keeping your bones strong. When the  thyroid is overactive, it produces too much of a hormone called thyroxine. What are the causes? This condition may be caused by:  Graves' disease. This is a disorder in which the body's disease-fighting system (immune system) attacks the thyroid gland. This is the most common cause.  Inflammation of the thyroid gland.  A tumor in the thyroid gland.  Use of certain medicines, including: ? Prescription thyroid hormone replacement. ? Herbal supplements that mimic thyroid hormones. ? Amiodarone therapy.  Solid or fluid-filled lumps within your thyroid gland (thyroid nodules).  Taking in a large amount of iodine from foods or medicines. What increases the risk? You are more likely to develop this condition if:  You are male.  You have a family history of thyroid conditions.  You smoke tobacco.  You use a medicine called lithium.  You take medicines that affect the immune system (immunosuppressants). What are the signs or symptoms? Symptoms of this condition include:  Nervousness.  Inability to tolerate heat.  Unexplained weight loss.  Diarrhea.  Change in the texture of hair or skin.  Heart skipping beats or making extra beats.  Rapid heart rate.  Loss of menstruation.  Shaky hands.  Fatigue.  Restlessness.  Sleep problems.  Enlarged thyroid gland or a lump in the thyroid (nodule). You may also have symptoms of Graves' disease, which may include:  Protruding eyes.  Dry eyes.  Red  or swollen eyes.  Problems with vision. How is this diagnosed? This condition may be diagnosed based on:  Your symptoms and medical history.  A physical exam.  Blood tests.  Thyroid ultrasound. This test involves using sound waves to produce images of the thyroid gland.  A thyroid scan. A radioactive substance is injected into a vein, and images show how much iodine is present in the thyroid.  Radioactive iodine uptake test (RAIU). A small amount of radioactive  iodine is given by mouth to see how much iodine the thyroid absorbs after a certain amount of time. How is this treated? Treatment depends on the cause and severity of the condition. Treatment may include:  Medicines to reduce the amount of thyroid hormone your body makes.  Radioactive iodine treatment (radioiodine therapy). This involves swallowing a small dose of radioactive iodine, in capsule or liquid form, to kill thyroid cells.  Surgery to remove part or all of your thyroid gland. You may need to take thyroid hormone replacement medicine for the rest of your life after thyroid surgery.  Medicines to help manage your symptoms. Follow these instructions at home:   Take over-the-counter and prescription medicines only as told by your health care provider.  Do not use any products that contain nicotine or tobacco, such as cigarettes and e-cigarettes. If you need help quitting, ask your health care provider.  Follow any instructions from your health care provider about diet. You may be instructed to limit foods that contain iodine.  Keep all follow-up visits as told by your health care provider. This is important. ? You will need to have blood tests regularly so that your health care provider can monitor your condition. Contact a health care provider if:  Your symptoms do not get better with treatment.  You have a fever.  You are taking thyroid hormone replacement medicine and you: ? Have symptoms of depression. ? Feel like you are tired all the time. ? Gain weight. Get help right away if:  You have chest pain.  You have decreased alertness or a change in your awareness.  You have abdominal pain.  You feel dizzy.  You have a rapid heartbeat.  You have an irregular heartbeat.  You have difficulty breathing. Summary  The thyroid gland is a small gland located in the lower front part of the neck, just in front of the windpipe (trachea).  Hyperthyroidism is when the  thyroid gland is too active (overactive) and produces too much of a hormone called thyroxine.  The most common cause is Graves' disease, a disorder in which your immune system attacks the thyroid gland.  Hyperthyroidism can cause various symptoms, such as unexplained weight loss, nervousness, inability to tolerate heat, or changes in your heartbeat.  Treatment may include medicine to reduce the amount of thyroid hormone your body makes, radioiodine therapy, surgery, or medicines to manage symptoms. This information is not intended to replace advice given to you by your health care provider. Make sure you discuss any questions you have with your health care provider. Document Released: 11/30/2005 Document Revised: 11/10/2017 Document Reviewed: 11/10/2017 Elsevier Interactive Patient Education  2019 [employer].

## 2019-01-31 NOTE — Progress Notes (Signed)
Subjective:    Patient ID: Phillip Howell, male    DOB: 07/12/1986, 33 y.o.   MRN: [id number]  HPI Pt is referred by Dr M-S, for hyperthyroidism.  Pt reports he was dx'ed with hyperthyroidism in late 2019 (TSH was normal in 2018).  He has never been on therapy for this.  He has never had XRT to the anterior neck, or thyroid surgery.  He has never had thyroid imaging.  He does not consume kelp or any other non-prescribed thyroid medication.  He has never been on amiodarone.  He has slight tremor of the hands, and assoc fatigue.   Past Medical History:  Diagnosis Date  . Allergy    i.e dust  . Anxiety   . Back pain    upper   . Chicken pox   . Dyshidrotic eczema    L hand   . Hyperthyroidism     Past Surgical History:  Procedure Laterality Date  . NO PAST SURGERIES      Social History   Socioeconomic History  . Marital status: Married    Spouse name: Not on file  . Number of children: Not on file  . Years of education: Not on file  . Highest education level: Not on file  Occupational History  . Not on file  Social Needs  . Financial resource strain: Not on file  . Food insecurity:    Worry: Not on file    Inability: Not on file  . Transportation needs:    Medical: Not on file    Non-medical: Not on file  Tobacco Use  . Smoking status: Former Games developermoker  . Smokeless tobacco: Never Used  . Tobacco comment: smoked 18 to 21 4-5 cig qd   Substance and Sexual Activity  . Alcohol use: No  . Drug use: No  . Sexual activity: Yes    Comment: wife   Lifestyle  . Physical activity:    Days per week: Not on file    Minutes per session: Not on file  . Stress: Not on file  Relationships  . Social connections:    Talks on phone: Not on file    Gets together: Not on file    Attends religious service: Not on file    Active member of club or organization: Not on file    Attends meetings of clubs or organizations: Not on file    Relationship status: Not on file  . Intimate partner  violence:    Fear of current or ex partner: Not on file    Emotionally abused: Not on file    Physically abused: Not on file    Forced sexual activity: Not on file  Other Topics Concern  . Not on file  Social History Narrative   Works at [employer] with [employer]    Former smoker age [age]    From [place]    Married no kids    GED Licensed conveyancerJet operator    No current outpatient medications on file prior to visit.   No current facility-administered medications on file prior to visit.     No Known Allergies  Family History  Problem Relation Age of Onset  . Diabetes Father   . Mental illness Sister   . Mental retardation Sister        Down Syndrome   . Thyroid disease Neg Hx     BP 118/64 (BP Location: Right Arm, Patient Position: Sitting, Cuff Size: Normal)   Pulse 86  Ht 5\' 10"  (1.778 m)   Wt 207 lb 3.2 oz (94 kg)   SpO2 97%   BMI 29.73 kg/m   Review of Systems denies weight loss, hoarseness, diplopia, palpitations, sob, diarrhea, nocturia, muscle weakness, edema, excessive diaphoresis, seizure, easy bruising, and rhinorrhea.  He has anxiety, heat intolerance, and intermitt headache.       Objective:   Physical Exam VS: see vs page GEN: no distress HEAD: head: no deformity eyes: no periorbital swelling, no proptosis external nose and ears are normal mouth: no lesion seen NECK: supple, thyroid is not enlarged. CHEST WALL: no deformity LUNGS: clear to auscultation CV: reg rate and rhythm, no murmur ABD: abdomen is soft, nontender.  no hepatosplenomegaly.  not distended.  no hernia MUSCULOSKELETAL: muscle bulk and strength are grossly normal.  no obvious joint swelling.  gait is normal and steady.   EXTEMITIES: no deformity.  no edema PULSES: no carotid bruit NEURO:  cn 2-12 grossly intact.   readily moves all 4's.  sensation is intact to touch on all 4's.  Slight tremor of the hands SKIN:  Normal texture and temperature.  No rash or suspicious lesion is visible.  Slight  diaphoretic  NODES:  None palpable at the neck PSYCH: alert, well-oriented.  Does not appear anxious nor depressed.   Lab Results  Component Value Date   TSH <0.01 (L) 12/27/2018    US: upper normal in size and hypervascular without focal discrete nodule.    I have reviewed outside records, and summarized: Pt was noted to have abnormal TFT, and referred here.  Main symptoms were anxiety and insomnia.     Assessment & Plan:  Hyperthyroidism, new.  Due to Grave's Dz.  We discussed rx options.  He chooses tapazole, at least for now   Patient Instructions  I have sent a prescription to your pharmacy If ever you have fever while taking methimazole, stop it and call us, even if the reason is obvious, because of the risk of a rare side-effect. Please come back for a follow-up appointment in 2-4 weeks.   In the future, you can take 1 of the other treatment options we discussed today. If you choose the radioactive iodine pill, it works like this: We would first check a thyroid "scan" (a special, but easy and painless type of thyroid x ray): you go to the x-ray department of the hospital to swallow a pill, which contains a miniscule amount of radiation.  You will not notice any symptoms from this.  You will go back to the x-ray department the next day, to lie down in front of a camera.  The results of this will be sent to me.   Based on the results, i hope to order for you a treatment pill of radioactive iodine.  Although it is a larger amount of radiation, you will again notice no symptoms from this.  The pill is gone from your body in a few days (during which you should stay away from other people), but takes several months to work.  Therefore, please return here approximately 6-8 weeks after the treatment.  This treatment has been available for many years, and the only known side-effect is an underactive thyroid.  It is possible that i would eventually prescribe for you a thyroid hormone pill,  which is very inexpensive.  You don't have to worry about side-effects of this thyroid hormone pill, because it is the same molecule your thyroid makes.      Hyperthyroidism  Hyperthyroidism is when the thyroid gland is too active (overactive). The thyroid gland is a small gland located in the lower front part of the neck, just in front of the windpipe (trachea). This gland makes hormones that help control how the body uses food for energy (metabolism) as well as how the heart and brain function. These hormones also play a role in keeping your bones strong. When the thyroid is overactive, it produces too much of a hormone called thyroxine. What are the causes? This condition may be caused by:  Graves' disease. This is a disorder in which the body's disease-fighting system (immune system) attacks the thyroid gland. This is the most common cause.  Inflammation of the thyroid gland.  A tumor in the thyroid gland.  Use of certain medicines, including: ? Prescription thyroid hormone replacement. ? Herbal supplements that mimic thyroid hormones. ? Amiodarone therapy.  Solid or fluid-filled lumps within your thyroid gland (thyroid nodules).  Taking in a large amount of iodine from foods or medicines. What increases the risk? You are more likely to develop this condition if:  You are male.  You have a family history of thyroid conditions.  You smoke tobacco.  You use a medicine called lithium.  You take medicines that affect the immune system (immunosuppressants). What are the signs or symptoms? Symptoms of this condition include:  Nervousness.  Inability to tolerate heat.  Unexplained weight loss.  Diarrhea.  Change in the texture of hair or skin.  Heart skipping beats or making extra beats.  Rapid heart rate.  Loss of menstruation.  Shaky hands.  Fatigue.  Restlessness.  Sleep problems.  Enlarged thyroid gland or a lump in the thyroid (nodule). You may  also have symptoms of Graves' disease, which may include:  Protruding eyes.  Dry eyes.  Red or swollen eyes.  Problems with vision. How is this diagnosed? This condition may be diagnosed based on:  Your symptoms and medical history.  A physical exam.  Blood tests.  Thyroid ultrasound. This test involves using sound waves to produce images of the thyroid gland.  A thyroid scan. A radioactive substance is injected into a vein, and images show how much iodine is present in the thyroid.  Radioactive iodine uptake test (RAIU). A small amount of radioactive iodine is given by mouth to see how much iodine the thyroid absorbs after a certain amount of time. How is this treated? Treatment depends on the cause and severity of the condition. Treatment may include:  Medicines to reduce the amount of thyroid hormone your body makes.  Radioactive iodine treatment (radioiodine therapy). This involves swallowing a small dose of radioactive iodine, in capsule or liquid form, to kill thyroid cells.  Surgery to remove part or all of your thyroid gland. You may need to take thyroid hormone replacement medicine for the rest of your life after thyroid surgery.  Medicines to help manage your symptoms. Follow these instructions at home:   Take over-the-counter and prescription medicines only as told by your health care provider.  Do not use any products that contain nicotine or tobacco, such as cigarettes and e-cigarettes. If you need help quitting, ask your health care provider.  Follow any instructions from your health care provider about diet. You may be instructed to limit foods that contain iodine.  Keep all follow-up visits as told by your health care provider. This is important. ? You will need to have blood tests regularly so that your health care provider can monitor your  condition. Contact a health care provider if:  Your symptoms do not get better with treatment.  You have a  fever.  You are taking thyroid hormone replacement medicine and you: ? Have symptoms of depression. ? Feel like you are tired all the time. ? Gain weight. Get help right away if:  You have chest pain.  You have decreased alertness or a change in your awareness.  You have abdominal pain.  You feel dizzy.  You have a rapid heartbeat.  You have an irregular heartbeat.  You have difficulty breathing. Summary  The thyroid gland is a small gland located in the lower front part of the neck, just in front of the windpipe (trachea).  Hyperthyroidism is when the thyroid gland is too active (overactive) and produces too much of a hormone called thyroxine.  The most common cause is Graves' disease, a disorder in which your immune system attacks the thyroid gland.  Hyperthyroidism can cause various symptoms, such as unexplained weight loss, nervousness, inability to tolerate heat, or changes in your heartbeat.  Treatment may include medicine to reduce the amount of thyroid hormone your body makes, radioiodine therapy, surgery, or medicines to manage symptoms. This information is not intended to replace advice given to you by your health care provider. Make sure you discuss any questions you have with your health care provider. Document Released: 11/30/2005 Document Revised: 11/10/2017 Document Reviewed: 11/10/2017 Elsevier Interactive Patient Education  2019 [employer].

## 2019-02-03 ENCOUNTER — Ambulatory Visit (INDEPENDENT_AMBULATORY_CARE_PROVIDER_SITE_OTHER): Payer: 59 | Admitting: Internal Medicine

## 2019-02-03 ENCOUNTER — Encounter: Payer: Self-pay | Admitting: Internal Medicine

## 2019-02-03 VITALS — BP 134/76 | HR 78 | Temp 98.4°F | Ht 70.0 in | Wt 211.2 lb

## 2019-02-03 DIAGNOSIS — E0590 Thyrotoxicosis, unspecified without thyrotoxic crisis or storm: Secondary | ICD-10-CM

## 2019-02-03 DIAGNOSIS — J309 Allergic rhinitis, unspecified: Secondary | ICD-10-CM | POA: Diagnosis not present

## 2019-02-03 DIAGNOSIS — J0100 Acute maxillary sinusitis, unspecified: Secondary | ICD-10-CM

## 2019-02-03 MED ORDER — AMOXICILLIN-POT CLAVULANATE 875-125 MG PO TABS: 1.0000 | ORAL_TABLET | Freq: Two times a day (BID) | ORAL | 0 refills | Status: DC

## 2019-02-03 MED ORDER — FLUTICASONE PROPIONATE 50 MCG/ACT NA SUSP: 2.0000 | Freq: Every day | NASAL | 11 refills | Status: DC | PRN

## 2019-02-03 MED ORDER — SALINE SPRAY 0.65 % NA SOLN: 1.0000 | NASAL | 0 refills | Status: DC | PRN

## 2019-02-03 NOTE — Progress Notes (Signed)
Chief Complaint  Patient presents with  . Nasal Congestion   Sick visit  1. C/o runny nose, right cheek pain since Monday mom was sick last week no bodyaches, fever nothing tried feels sinus pressure right cheek only. He does have allergies takes prn Claritin but not taking now   2. Hyperthyroidism on methimazole f/u endocrine 02/2019 when takes medication makes him dizzy for a second but otherwise tolerating    Review of Systems  Constitutional: Negative for fever.  HENT: Positive for sinus pain.        +runny nose and congestion    Respiratory: Negative for cough, sputum production and shortness of breath.   Cardiovascular: Negative for chest pain.  Musculoskeletal: Negative for myalgias.  Neurological: Positive for dizziness.   Past Medical History:  Diagnosis Date  . Allergy    i.e dust  . Anxiety   . Back pain    upper   . Chicken pox   . Dyshidrotic eczema    L hand   . Hyperthyroidism    Past Surgical History:  Procedure Laterality Date  . NO PAST SURGERIES     Family History  Problem Relation Age of Onset  . Diabetes Father   . Mental illness Sister   . Mental retardation Sister        Down Syndrome   . Thyroid disease Neg Hx    Social History   Socioeconomic History  . Marital status: Married    Spouse name: Not on file  . Number of children: Not on file  . Years of education: Not on file  . Highest education level: Not on file  Occupational History  . Not on file  Social Needs  . Financial resource strain: Not on file  . Food insecurity:    Worry: Not on file    Inability: Not on file  . Transportation needs:    Medical: Not on file    Non-medical: Not on file  Tobacco Use  . Smoking status: Former Research scientist (life sciences)  . Smokeless tobacco: Never Used  . Tobacco comment: smoked 18 to 21 4-5 cig qd   Substance and Sexual Activity  . Alcohol use: No  . Drug use: No  . Sexual activity: Yes    Comment: wife   Lifestyle  . Physical activity:    Days per  week: Not on file    Minutes per session: Not on file  . Stress: Not on file  Relationships  . Social connections:    Talks on phone: Not on file    Gets together: Not on file    Attends religious service: Not on file    Active member of club or organization: Not on file    Attends meetings of clubs or organizations: Not on file    Relationship status: Not on file  . Intimate partner violence:    Fear of current or ex partner: Not on file    Emotionally abused: Not on file    Physically abused: Not on file    Forced sexual activity: Not on file  Other Topics Concern  . Not on file  Social History Narrative   Works at [employer] with [employer]    Former smoker age [age]    From [place]    Married no kids    GED Haematologist   Current Meds  Medication Sig  . methimazole (TAPAZOLE) 10 MG tablet Take 1 tablet (10 mg total) by mouth 2 (two) times daily.  No Known Allergies Recent Results (from the past 2160 hour(s))  Hepatitis B surface antibody     Status: None   Collection Time: 12/06/18  7:56 AM  Result Value Ref Range   Hepatitis B-Post 21 > OR = 10 mIU/mL    Comment: . Patient has immunity to hepatitis B virus. . For additional information, please refer to http://education.[facility].com/faq/FAQ105 (This link is being provided for informational/ educational purposes only).   Vitamin D (25 hydroxy)     Status: None   Collection Time: 12/06/18  7:56 AM  Result Value Ref Range   VITD 30.40 30.00 - 100.00 ng/mL  Measles/Mumps/Rubella Immunity     Status: None   Collection Time: 12/06/18  7:56 AM  Result Value Ref Range   Rubeola IgG 292.00 AU/mL    Comment: AU/mL            Interpretation -----            -------------- <13.50           Negative 13.50-16.49      Equivocal >16.49           Positive . A positive result indicates that the patient has antibody to measles virus. It does not differentiate  between an active or past infection. The clinical  diagnosis  must be interpreted in conjunction with  clinical signs and symptoms of the patient.    Mumps IgG 183.00 AU/mL    Comment:  AU/mL           Interpretation -------         ---------------- <9.00             Negative 9.00-10.99        Equivocal >10.99            Positive A positive result indicates that the patient has  antibody to mumps virus. It does not differentiate between an  active or past infection. The clinical diagnosis must be interpreted in conjunction with clinical signs and symptoms of the patient. .    Rubella 22.80 index    Comment:     Index            Interpretation     -----            --------------       <0.90            Not consistent with Immunity     0.90-0.99        Equivocal     > or = 1.00      Consistent with Immunity  . The presence of rubella IgG antibody suggests  immunization or past or current infection with rubella virus.   Comprehensive metabolic panel     Status: None   Collection Time: 12/06/18  7:56 AM  Result Value Ref Range   Sodium 139 135 - 145 mEq/L   Potassium 4.4 3.5 - 5.1 mEq/L   Chloride 106 96 - 112 mEq/L   CO2 26 19 - 32 mEq/L   Glucose, Bld 93 70 - 99 mg/dL   BUN 13 6 - 23 mg/dL   Creatinine, Ser 0.61 0.40 - 1.50 mg/dL   Total Bilirubin 0.6 0.2 - 1.2 mg/dL   Alkaline Phosphatase 68 39 - 117 U/L   AST 20 0 - 37 U/L   ALT 18 0 - 53 U/L   Total Protein 7.0 6.0 - 8.3 g/dL   Albumin 4.0 3.5 - 5.2 g/dL   Calcium  9.8 8.4 - 10.5 mg/dL   GFR 161.83 >60.00 mL/min  TSH     Status: Abnormal   Collection Time: 12/06/18  7:56 AM  Result Value Ref Range   TSH <0.01 Repeated and verified X2. (L) 0.35 - 4.50 uIU/mL  Lipid panel     Status: Abnormal   Collection Time: 12/06/18  7:56 AM  Result Value Ref Range   Cholesterol 79 0 - 200 mg/dL    Comment: ATP III Classification       Desirable:  < 200 mg/dL               Borderline High:  200 - 239 mg/dL          High:  > = 240 mg/dL   Triglycerides 122.0 0.0 - 149.0 mg/dL    Comment:  Normal:  <150 mg/dLBorderline High:  150 - 199 mg/dL   HDL 17.70 (L) >39.00 mg/dL   VLDL 24.4 0.0 - 40.0 mg/dL   LDL Cholesterol 37 0 - 99 mg/dL   Total CHOL/HDL Ratio 4     Comment:                Men          Women1/2 Average Risk     3.4          3.3Average Risk          5.0          4.42X Average Risk          9.6          7.13X Average Risk          15.0          11.0                       NonHDL 61.78     Comment: NOTE:  Non-HDL goal should be 30 mg/dL higher than patient's LDL goal (i.e. LDL goal of < 70 mg/dL, would have non-HDL goal of < 100 mg/dL)  CBC with Differential/Platelet     Status: None   Collection Time: 12/06/18  7:56 AM  Result Value Ref Range   WBC 4.3 4.0 - 10.5 K/uL   RBC 4.73 4.22 - 5.81 Mil/uL   Hemoglobin 15.2 13.0 - 17.0 g/dL   HCT 44.4 39.0 - 52.0 %   MCV 93.8 78.0 - 100.0 fl   MCHC 34.2 30.0 - 36.0 g/dL   RDW 11.9 11.5 - 15.5 %   Platelets 187.0 150.0 - 400.0 K/uL   Neutrophils Relative % 53.0 43.0 - 77.0 %   Lymphocytes Relative 33.0 12.0 - 46.0 %   Monocytes Relative 10.2 3.0 - 12.0 %   Eosinophils Relative 3.0 0.0 - 5.0 %   Basophils Relative 0.8 0.0 - 3.0 %   Neutro Abs 2.3 1.4 - 7.7 K/uL   Lymphs Abs 1.4 0.7 - 4.0 K/uL   Monocytes Absolute 0.4 0.1 - 1.0 K/uL   Eosinophils Absolute 0.1 0.0 - 0.7 K/uL   Basophils Absolute 0.0 0.0 - 0.1 K/uL  T4, free     Status: Abnormal   Collection Time: 12/06/18  7:56 AM  Result Value Ref Range   Free T4 2.8 (H) 0.8 - 1.8 ng/dL  T3, free     Status: Abnormal   Collection Time: 12/06/18  7:56 AM  Result Value Ref Range   T3, Free 10.5 (H) 2.3 - 4.2 pg/mL  Thyroid peroxidase antibody  Status: Abnormal   Collection Time: 12/06/18  7:56 AM  Result Value Ref Range   Thyroperoxidase Ab SerPl-aCnc 47 (H) <9 IU/mL  TEST AUTHORIZATION     Status: None   Collection Time: 12/06/18  7:56 AM  Result Value Ref Range   TEST NAME: T3, FREE THYROID PEROXIDASE T    TEST CODE: [id number]    CLIENT  CONTACT: LATOYA WRIGHT    REPORT ALWAYS MESSAGE SIGNATURE      Comment: . The laboratory testing on this patient was verbally requested or confirmed by the ordering physician or his or her authorized representative after contact with an employee of [employer]. Federal regulations require that we maintain on file written authorization for all laboratory testing.  Accordingly we are asking that the ordering physician or his or her authorized representative sign a copy of this report and promptly return it to the client service representative. . . Signature:____________________________________________________ . Please fax this signed page to [phone] or return it via your [employer] courier.   T4, free     Status: Abnormal   Collection Time: 12/27/18 11:10 AM  Result Value Ref Range   Free T4 2.2 (H) 0.8 - 1.8 ng/dL  T3, free     Status: Abnormal   Collection Time: 12/27/18 11:10 AM  Result Value Ref Range   T3, Free 8.8 (H) 2.3 - 4.2 pg/mL  Thyroid peroxidase antibody     Status: Abnormal   Collection Time: 12/27/18 11:10 AM  Result Value Ref Range   Thyroperoxidase Ab SerPl-aCnc 24 (H) <9 IU/mL  Urinalysis, Routine w reflex microscopic     Status: None   Collection Time: 12/27/18 11:10 AM  Result Value Ref Range   Color, Urine YELLOW YELLOW   APPearance CLEAR CLEAR   Specific Gravity, Urine 1.027 1.001 - 1.03   pH 6.0 5.0 - 8.0   Glucose, UA NEGATIVE NEGATIVE   Bilirubin Urine NEGATIVE NEGATIVE   Ketones, ur NEGATIVE NEGATIVE   Hgb urine dipstick NEGATIVE NEGATIVE   Protein, ur NEGATIVE NEGATIVE   Nitrite NEGATIVE NEGATIVE   Leukocytes, UA NEGATIVE NEGATIVE  TSH     Status: Abnormal   Collection Time: 12/27/18 11:10 AM  Result Value Ref Range   TSH <0.01 (L) 0.40 - 4.50 mIU/L  TEST AUTHORIZATION     Status: None   Collection Time: 12/27/18 11:10 AM  Result Value Ref Range   TEST NAME: TSH    TEST CODE: [id number]    CLIENT CONTACT: BRITNEY WIGGINS     REPORT ALWAYS MESSAGE SIGNATURE      Comment: . The laboratory testing on this patient was verbally requested or confirmed by the ordering physician or his or her authorized representative after contact with an employee of [employer]. Federal regulations require that we maintain on file written authorization for all laboratory testing.  Accordingly we are asking that the ordering physician or his or her authorized representative sign a copy of this report and promptly return it to the client service representative. . . Signature:____________________________________________________ . Please fax this signed page to [phone] or return it via your [employer] courier.    Objective  Body mass index is 30.3 kg/m. Wt Readings from Last 3 Encounters:  02/03/19 211 lb 3.2 oz (95.8 kg)  01/31/19 207 lb 3.2 oz (94 kg)  12/29/18 206 lb 6.4 oz (93.6 kg)   Temp Readings from Last 3 Encounters:  02/03/19 98.4 F (36.9 C) (Oral)  12/29/18 97.6 F (36.4  C) (Oral)  10/18/18 97.8 F (36.6 C) (Oral)   BP Readings from Last 3 Encounters:  02/03/19 134/76  01/31/19 118/64  12/29/18 (!) 136/58   Pulse Readings from Last 3 Encounters:  02/03/19 78  01/31/19 86  12/29/18 70    Physical Exam Vitals signs and nursing note reviewed.  Constitutional:      Appearance: Normal appearance. He is well-developed and well-groomed.  HENT:     Head: Normocephalic and atraumatic.     Nose:     Right Sinus: Maxillary sinus tenderness present. No frontal sinus tenderness.     Left Sinus: No maxillary sinus tenderness or frontal sinus tenderness.     Mouth/Throat:     Mouth: Mucous membranes are moist.     Pharynx: Oropharynx is clear.  Eyes:     Conjunctiva/sclera: Conjunctivae normal.     Pupils: Pupils are equal, round, and reactive to light.  Cardiovascular:     Rate and Rhythm: Normal rate and regular rhythm.     Heart sounds: Normal heart sounds.  Pulmonary:      Effort: Pulmonary effort is normal.     Breath sounds: Normal breath sounds. No wheezing.  Skin:    General: Skin is warm and dry.  Neurological:     General: No focal deficit present.     Mental Status: He is alert and oriented to person, place, and time. Mental status is at baseline.     Gait: Gait normal.  Psychiatric:        Attention and Perception: Attention and perception normal.        Mood and Affect: Mood and affect normal.        Speech: Speech normal.        Behavior: Behavior normal. Behavior is cooperative.        Thought Content: Thought content normal.        Cognition and Memory: Cognition and memory normal.        Judgment: Judgment normal.     Assessment   1. Sinusitis and allergies  2. Hyperthyroidism  3. HM Plan   1.Augmentin bid x 1 week  NS, flonase Prn claritin  2. F/u endocrine 02/2019  3.  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 Olivia Mackie McLean-Scocuzza-[facility]

## 2019-02-03 NOTE — Progress Notes (Signed)
Pre visit review using our clinic review tool, if applicable. No additional management support is needed unless otherwise documented below in the visit note. 

## 2019-02-03 NOTE — Patient Instructions (Signed)
Sinusitis, Adult  Sinusitis is inflammation of your sinuses. Sinuses are hollow spaces in the bones around your face. Your sinuses are located:   Around your eyes.   In the middle of your forehead.   Behind your nose.   In your cheekbones.  Mucus normally drains out of your sinuses. When your nasal tissues become inflamed or swollen, mucus can become trapped or blocked. This allows bacteria, viruses, and fungi to grow, which leads to infection. Most infections of the sinuses are caused by a virus.  Sinusitis can develop quickly. It can last for up to 4 weeks (acute) or for more than 12 weeks (chronic). Sinusitis often develops after a cold.  What are the causes?  This condition is caused by anything that creates swelling in the sinuses or stops mucus from draining. This includes:   Allergies.   Asthma.   Infection from bacteria or viruses.   Deformities or blockages in your nose or sinuses.   Abnormal growths in the nose (nasal polyps).   Pollutants, such as chemicals or irritants in the air.   Infection from fungi (rare).  What increases the risk?  You are more likely to develop this condition if you:   Have a weak body defense system (immune system).   Do a lot of swimming or diving.   Overuse nasal sprays.   Smoke.  What are the signs or symptoms?  The main symptoms of this condition are pain and a feeling of pressure around the affected sinuses. Other symptoms include:   Stuffy nose or congestion.   Thick drainage from your nose.   Swelling and warmth over the affected sinuses.   Headache.   Upper toothache.   A cough that may get worse at night.   Extra mucus that collects in the throat or the back of the nose (postnasal drip).   Decreased sense of smell and taste.   Fatigue.   A fever.   Sore throat.   Bad breath.  How is this diagnosed?  This condition is diagnosed based on:   Your symptoms.   Your medical history.   A physical exam.   Tests to find out if your condition is  acute or chronic. This may include:  ? Checking your nose for nasal polyps.  ? Viewing your sinuses using a device that has a light (endoscope).  ? Testing for allergies or bacteria.  ? Imaging tests, such as an MRI or CT scan.  In rare cases, a bone biopsy may be done to rule out more serious types of fungal sinus disease.  How is this treated?  Treatment for sinusitis depends on the cause and whether your condition is chronic or acute.   If caused by a virus, your symptoms should go away on their own within 10 days. You may be given medicines to relieve symptoms. They include:  ? Medicines that shrink swollen nasal passages (topical intranasal decongestants).  ? Medicines that treat allergies (antihistamines).  ? A spray that eases inflammation of the nostrils (topical intranasal corticosteroids).  ? Rinses that help get rid of thick mucus in your nose (nasal saline washes).   If caused by bacteria, your health care provider may recommend waiting to see if your symptoms improve. Most bacterial infections will get better without antibiotic medicine. You may be given antibiotics if you have:  ? A severe infection.  ? A weak immune system.   If caused by narrow nasal passages or nasal polyps, you may need   to have surgery.  Follow these instructions at home:  Medicines   Take, use, or apply over-the-counter and prescription medicines only as told by your health care provider. These may include nasal sprays.   If you were prescribed an antibiotic medicine, take it as told by your health care provider. Do not stop taking the antibiotic even if you start to feel better.  Hydrate and humidify     Drink enough fluid to keep your urine pale yellow. Staying hydrated will help to thin your mucus.   Use a cool mist humidifier to keep the humidity level in your home above 50%.   Inhale steam for 10-15 minutes, 3-4 times a day, or as told by your health care provider. You can do this in the bathroom while a hot shower is  running.   Limit your exposure to cool or dry air.  Rest   Rest as much as possible.   Sleep with your head raised (elevated).   Make sure you get enough sleep each night.  General instructions     Apply a warm, moist washcloth to your face 3-4 times a day or as told by your health care provider. This will help with discomfort.   Wash your hands often with soap and water to reduce your exposure to germs. If soap and water are not available, use hand sanitizer.   Do not smoke. Avoid being around people who are smoking (secondhand smoke).   Keep all follow-up visits as told by your health care provider. This is important.  Contact a health care provider if:   You have a fever.   Your symptoms get worse.   Your symptoms do not improve within 10 days.  Get help right away if:   You have a severe headache.   You have persistent vomiting.   You have severe pain or swelling around your face or eyes.   You have vision problems.   You develop confusion.   Your neck is stiff.   You have trouble breathing.  Summary   Sinusitis is soreness and inflammation of your sinuses. Sinuses are hollow spaces in the bones around your face.   This condition is caused by nasal tissues that become inflamed or swollen. The swelling traps or blocks the flow of mucus. This allows bacteria, viruses, and fungi to grow, which leads to infection.   If you were prescribed an antibiotic medicine, take it as told by your health care provider. Do not stop taking the antibiotic even if you start to feel better.   Keep all follow-up visits as told by your health care provider. This is important.  This information is not intended to replace advice given to you by your health care provider. Make sure you discuss any questions you have with your health care provider.  Document Released: 11/30/2005 Document Revised: 05/02/2018 Document Reviewed: 05/02/2018  Elsevier Interactive Patient Education  2019 [employer] Inc[employer]

## 2019-02-22 ENCOUNTER — Ambulatory Visit: Payer: 59 | Admitting: Endocrinology

## 2019-02-27 ENCOUNTER — Ambulatory Visit: Payer: 59 | Admitting: Endocrinology

## 2019-03-06 ENCOUNTER — Other Ambulatory Visit: Payer: Self-pay

## 2019-03-06 ENCOUNTER — Encounter: Payer: Self-pay | Admitting: Endocrinology

## 2019-03-06 ENCOUNTER — Ambulatory Visit (INDEPENDENT_AMBULATORY_CARE_PROVIDER_SITE_OTHER): Payer: 59 | Admitting: Endocrinology

## 2019-03-06 VITALS — BP 116/74 | HR 60 | Temp 98.2°F | Resp 12 | Ht 70.0 in | Wt 214.0 lb

## 2019-03-06 DIAGNOSIS — E0590 Thyrotoxicosis, unspecified without thyrotoxic crisis or storm: Secondary | ICD-10-CM

## 2019-03-06 LAB — TSH: TSH: 0.01 u[IU]/mL — ABNORMAL LOW (ref 0.35–4.50)

## 2019-03-06 LAB — T4, FREE: FREE T4: 0.62 ng/dL (ref 0.60–1.60)

## 2019-03-06 MED ORDER — METHIMAZOLE 10 MG PO TABS: 10.0000 mg | ORAL_TABLET | Freq: Every day | ORAL | 5 refills | Status: DC

## 2019-03-06 NOTE — Progress Notes (Signed)
Subjective:    Patient ID: Phillip Howell, male    DOB: November 07, 1986, 33 y.o.   MRN: [id number]  HPI Pt returns for f/u of hyperthyroidism (dx'ed late 2019 (TSH was normal in 2018); US showed thyroid is upper normal in size and hypervascular without focal discrete nodule).  Since on the tapazole, tremor is less, but fatigue persists.  Past Medical History:  Diagnosis Date  . Allergy    i.e dust  . Anxiety   . Back pain    upper   . Chicken pox   . Dyshidrotic eczema    L hand   . Hyperthyroidism     Past Surgical History:  Procedure Laterality Date  . NO PAST SURGERIES      Social History   Socioeconomic History  . Marital status: Married    Spouse name: Not on file  . Number of children: Not on file  . Years of education: Not on file  . Highest education level: Not on file  Occupational History  . Not on file  Social Needs  . Financial resource strain: Not on file  . Food insecurity:    Worry: Not on file    Inability: Not on file  . Transportation needs:    Medical: Not on file    Non-medical: Not on file  Tobacco Use  . Smoking status: Former Games developer  . Smokeless tobacco: Never Used  . Tobacco comment: smoked 18 to 21 4-5 cig qd   Substance and Sexual Activity  . Alcohol use: No  . Drug use: No  . Sexual activity: Yes    Comment: wife   Lifestyle  . Physical activity:    Days per week: Not on file    Minutes per session: Not on file  . Stress: Not on file  Relationships  . Social connections:    Talks on phone: Not on file    Gets together: Not on file    Attends religious service: Not on file    Active member of club or organization: Not on file    Attends meetings of clubs or organizations: Not on file    Relationship status: Not on file  . Intimate partner violence:    Fear of current or ex partner: Not on file    Emotionally abused: Not on file    Physically abused: Not on file    Forced sexual activity: Not on file  Other Topics Concern  . Not  on file  Social History Narrative   Works at [employer] with [employer]    Former smoker age [age]    From [place]    Married no kids    GED Licensed conveyancer    No current outpatient medications on file prior to visit.   No current facility-administered medications on file prior to visit.     No Known Allergies  Family History  Problem Relation Age of Onset  . Diabetes Father   . Mental illness Sister   . Mental retardation Sister        Down Syndrome   . Thyroid disease Neg Hx     BP 116/74 (BP Location: Right Arm, Patient Position: Sitting, Cuff Size: Normal)   Pulse 60   Temp 98.2 F (36.8 C)   Resp 12   Ht 5\' 10"  (1.778 m)   Wt 214 lb (97.1 kg)   SpO2 98%   BMI 30.71 kg/m   Review of Systems Denies fever.      Objective:  Physical Exam VITAL SIGNS:  See vs page.  GENERAL: no distress.  NECK: There is no palpable thyroid enlargement.  No thyroid nodule is palpable.  No palpable lymphadenopathy at the anterior neck.    Lab Results  Component Value Date   TSH 0.01 (L) 03/06/2019      Assessment & Plan:  Hyperthyroidism: improved. Fatigue: I told pt I am not sure if this is thyroid-related.    Patient Instructions  Blood tests are requested for you today.  We'll let you know about the results.  If ever you have fever while taking methimazole, stop it and call us, even if the reason is obvious, because of the risk of a rare side-effect. In the future, you can change your mind and take 1 of the other treatment options we discussed, if you want.  Please come back for a follow-up appointment in 4-6 weeks.

## 2019-03-06 NOTE — Patient Instructions (Signed)
Blood tests are requested for you today.  We'll let you know about the results.  If ever you have fever while taking methimazole, stop it and call us, even if the reason is obvious, because of the risk of a rare side-effect. In the future, you can change your mind and take 1 of the other treatment options we discussed, if you want.  Please come back for a follow-up appointment in 4-6 weeks.

## 2019-04-06 ENCOUNTER — Ambulatory Visit: Payer: 59 | Admitting: Endocrinology

## 2019-04-12 ENCOUNTER — Telehealth: Payer: Self-pay

## 2019-04-12 NOTE — Telephone Encounter (Signed)
Overdue for an appt. LVM requesting returned call. 

## 2019-04-18 NOTE — Telephone Encounter (Signed)
SECOND ATTEMPT  Called pt to schedule appt. LVM. Letter mailed

## 2019-05-01 ENCOUNTER — Ambulatory Visit (INDEPENDENT_AMBULATORY_CARE_PROVIDER_SITE_OTHER): Payer: 59 | Admitting: Family Medicine

## 2019-05-01 ENCOUNTER — Other Ambulatory Visit: Payer: Self-pay

## 2019-05-01 ENCOUNTER — Telehealth: Payer: Self-pay | Admitting: Family Medicine

## 2019-05-01 DIAGNOSIS — F418 Other specified anxiety disorders: Secondary | ICD-10-CM | POA: Insufficient documentation

## 2019-05-01 MED ORDER — ESCITALOPRAM OXALATE 10 MG PO TABS: 10.0000 mg | ORAL_TABLET | Freq: Every day | ORAL | 2 refills | Status: DC

## 2019-05-01 NOTE — Telephone Encounter (Signed)
Please schedule 3-4 week follow up for recheck after starting lexapro   Thanks  LG

## 2019-05-01 NOTE — Telephone Encounter (Signed)
Appt scheduled for 05/29/2019@ 9:00 am

## 2019-05-01 NOTE — Progress Notes (Signed)
Patient ID: Phillip Howell, male   DOB: 09/01/1986, 33 y.o.   MRN: [id number]     Virtual Visit via video Note  This visit type was conducted due to national recommendations for restrictions regarding the COVID-19 pandemic (e.g. social distancing).  This format is felt to be most appropriate for this patient at this time.  All issues noted in this document were discussed and addressed.  No physical exam was performed (except for noted visual exam findings with Video Visits).   I connected with Phillip Howell today at  9:20 AM EDT by a video enabled telemedicine application and verified that I am speaking with the correct person using two identifiers. Location patient: home Location provider: [facility] Persons participating in the virtual visit: patient, provider  I discussed the limitations, risks, security and privacy concerns of performing an evaluation and management service by video and the availability of in person appointments. I also discussed with the patient that there may be a patient responsible charge related to this service. The patient expressed understanding and agreed to proceed.   HPI:  Patient and I connected via video due to having days of feeling down and feeling more anxious than usual.  Patient cannot put finger on anything in his life that is very different, he continues to work throughout the pandemic.  States he does feel the general stress a lot of people also feel due to the different changes because of the pandemic with closures restaurants and not being able to go up and do things like before.  States when he gets home from work, he will go to sleep (he does work third shift) but feels he is sleeping more than usual and often wants to be left alone when he gets home.  He has never been on medication for anxiety or depression before.  He does take methimazole for hyperthyroidism; most recently his lab work at end of March appeared improved and his dose was decreased to 1  tablet daily.  He does have upcoming appointment with endocrinology next week for follow-up.   ROS:    Constitutional: Negative for chills, fatigue and fever.  HENT: Negative for congestion, ear pain, sinus pain and sore throat.   Eyes: Negative.   Respiratory: Negative for cough, shortness of breath and wheezing.   Cardiovascular: Negative for chest pain, palpitations and leg swelling.  Gastrointestinal: Negative for abdominal pain, diarrhea, nausea and vomiting.  Genitourinary: Negative for dysuria, frequency and urgency.  Musculoskeletal: Negative for arthralgias and myalgias.  Skin: Negative for color change, pallor and rash.  Neurological: Negative for syncope, light-headedness and headaches.  Psychiatric/Behavioral: Feeling more sleepy, irritable at times, will get home from work and wants to be left alone. Denies SI or HI.    Past Medical History:  Diagnosis Date  . Allergy    i.e dust  . Anxiety   . Back pain    upper   . Chicken pox   . Dyshidrotic eczema    L hand   . Hyperthyroidism     Past Surgical History:  Procedure Laterality Date  . NO PAST SURGERIES      Family History  Problem Relation Age of Onset  . Diabetes Father   . Mental illness Sister   . Mental retardation Sister        Down Syndrome   . Thyroid disease Neg Hx    Social History   Tobacco Use  . Smoking status: Former Games developermoker  . Smokeless tobacco: Never Used  .  Tobacco comment: smoked 18 to 21 4-5 cig qd   Substance Use Topics  . Alcohol use: No    Current Outpatient Medications:  .  methimazole (TAPAZOLE) 10 MG tablet, Take 1 tablet (10 mg total) by mouth daily., Disp: 30 tablet, Rfl: 5 .  escitalopram (LEXAPRO) 10 MG tablet, Take 1 tablet (10 mg total) by mouth daily., Disp: 30 tablet, Rfl: 2  EXAM:  GENERAL: alert, oriented, appears well and in no acute distress  HEENT: atraumatic, conjunttiva clear, no obvious abnormalities on inspection of external nose and ears  NECK:  normal movements of the head and neck  LUNGS: on inspection no signs of respiratory distress, breathing rate appears normal, no obvious gross SOB, gasping or wheezing  CV: no obvious cyanosis  MS: moves all visible extremities without noticeable abnormality  PSYCH/NEURO: pleasant and cooperative, no obvious depression or anxiety, speech and thought processing grossly intact  Depression screen [facility] 2/9 05/01/2019 05/01/2019 06/07/2018  Decreased Interest 1 0 0  Down, Depressed, Hopeless 1 0 0  PHQ - 2 Score 2 0 0  Altered sleeping 1 0 -  Tired, decreased energy 1 0 -  Change in appetite - 0 -  Feeling bad or failure about yourself  0 0 -  Trouble concentrating 0 0 -  Moving slowly or fidgety/restless 0 0 -  Suicidal thoughts 0 0 -  PHQ-9 Score 4 0 -  Difficult doing work/chores Somewhat difficult Not difficult at all -    ASSESSMENT AND PLAN:  Discussed the following assessment and plan:  Depression with anxiety - Plan: escitalopram (LEXAPRO) 10 MG tablet, Ambulatory referral to Psychology   We will start patient on Lexapro 10 mg daily to see if this helps better control mood.  We also will do referral to psychology for counseling.  Encourage patient to do different things to help improve mood like finding happy see enjoys in things that he can do even with a COVID-19 restriction such as going for walks at the park.  He will keep appointment with endocrinology for follow-up on hyperthyroidism.   I discussed the assessment and treatment plan with the patient. The patient was provided an opportunity to ask questions and all were answered. The patient agreed with the plan and demonstrated an understanding of the instructions.   The patient was advised to call back or seek an in-person evaluation if the symptoms worsen or if the condition fails to improve as anticipated.  We will plan for patient to follow-up here in 3 to 4 weeks for recheck after beginning Lexapro.  He is aware he can  call office sooner if needed.  Advised patient that referral to psychology has been placed, and they will contact him to get appointment set up for counseling sessions.  Tracey Harries, FNP

## 2019-05-05 ENCOUNTER — Encounter: Payer: Self-pay | Admitting: Endocrinology

## 2019-05-09 ENCOUNTER — Ambulatory Visit (INDEPENDENT_AMBULATORY_CARE_PROVIDER_SITE_OTHER): Payer: 59 | Admitting: Endocrinology

## 2019-05-09 ENCOUNTER — Other Ambulatory Visit: Payer: Self-pay

## 2019-05-09 DIAGNOSIS — E0590 Thyrotoxicosis, unspecified without thyrotoxic crisis or storm: Secondary | ICD-10-CM | POA: Diagnosis not present

## 2019-05-09 NOTE — Progress Notes (Signed)
Subjective:    Patient ID: Phillip Howell, male    DOB: [birth year], 33 y.o.   MRN: [id number]  HPI telehealth visit today via doxy video visit.  Alternatives to telehealth are presented to this patient, and the patient agrees to the telehealth visit.   Pt is advised of the cost of the visit, and agrees to this, also.   Patient is at home, and I am at the office.   Persons attending the telehealth visit: the patient and I.   Pt returns for f/u of hyperthyroidism (dx'ed late 2019 (TSH was normal in 2018); US showed thyroid is upper normal in size and hypervascular without focal discrete nodule; he declines RAI and surgery).  Since on the tapazole, tremor is less, but it persists.  Main symptom is anxiety.  He denies palpitations.   Past Medical History:  Diagnosis Date  . Allergy    i.e dust  . Anxiety   . Back pain    upper   . Chicken pox   . Dyshidrotic eczema    L hand   . Hyperthyroidism     Past Surgical History:  Procedure Laterality Date  . NO PAST SURGERIES      Social History   Socioeconomic History  . Marital status: Married    Spouse name: Not on file  . Number of children: Not on file  . Years of education: Not on file  . Highest education level: Not on file  Occupational History  . Not on file  Social Needs  . Financial resource strain: Not on file  . Food insecurity:    Worry: Not on file    Inability: Not on file  . Transportation needs:    Medical: Not on file    Non-medical: Not on file  Tobacco Use  . Smoking status: Former Games developer  . Smokeless tobacco: Never Used  . Tobacco comment: smoked 18 to 21 4-5 cig qd   Substance and Sexual Activity  . Alcohol use: No  . Drug use: No  . Sexual activity: Yes    Comment: wife   Lifestyle  . Physical activity:    Days per week: Not on file    Minutes per session: Not on file  . Stress: Not on file  Relationships  . Social connections:    Talks on phone: Not on file    Gets together: Not on file   Attends religious service: Not on file    Active member of club or organization: Not on file    Attends meetings of clubs or organizations: Not on file    Relationship status: Not on file  . Intimate partner violence:    Fear of current or ex partner: Not on file    Emotionally abused: Not on file    Physically abused: Not on file    Forced sexual activity: Not on file  Other Topics Concern  . Not on file  Social History Narrative   Works at [employer] with [employer]    Former smoker age [age]    From [place]    Married no kids    GED Licensed conveyancer    Current Outpatient Medications on File Prior to Visit  Medication Sig Dispense Refill  . escitalopram (LEXAPRO) 10 MG tablet Take 1 tablet (10 mg total) by mouth daily. 30 tablet 2  . methimazole (TAPAZOLE) 10 MG tablet Take 1 tablet (10 mg total) by mouth daily. 30 tablet 5   No current facility-administered medications  on file prior to visit.     No Known Allergies  Family History  Problem Relation Age of Onset  . Diabetes Father   . Mental illness Sister   . Mental retardation Sister        Down Syndrome   . Thyroid disease Neg Hx      Review of Systems Denies fever    Objective:   Physical Exam      Assessment & Plan:  Hyperthyroidism, due for recheck.  Goiter: recheck PE at next in-person visit.     Patient Instructions  Blood tests are requested for you today.  We'll let you know about the results.  If ever you have fever while taking methimazole, stop it and call us, even if the reason is obvious, because of the risk of a rare side-effect. In the future, you can change your mind and take 1 of the other treatment options we discussed, if you want.  Please come back for a follow-up appointment in 3 months.

## 2019-05-09 NOTE — Patient Instructions (Signed)
Blood tests are requested for you today.  We'll let you know about the results.  If ever you have fever while taking methimazole, stop it and call us, even if the reason is obvious, because of the risk of a rare side-effect. In the future, you can change your mind and take 1 of the other treatment options we discussed, if you want.  Please come back for a follow-up appointment in 3 months.

## 2019-05-16 ENCOUNTER — Ambulatory Visit: Payer: Self-pay | Admitting: Psychology

## 2019-05-29 ENCOUNTER — Ambulatory Visit: Payer: 59 | Admitting: Family Medicine

## 2019-06-07 ENCOUNTER — Ambulatory Visit: Payer: 59 | Admitting: Internal Medicine

## 2019-06-15 ENCOUNTER — Other Ambulatory Visit: Payer: Self-pay

## 2019-06-15 ENCOUNTER — Ambulatory Visit (INDEPENDENT_AMBULATORY_CARE_PROVIDER_SITE_OTHER): Payer: BC Managed Care – PPO | Admitting: Internal Medicine

## 2019-06-15 DIAGNOSIS — J309 Allergic rhinitis, unspecified: Secondary | ICD-10-CM

## 2019-06-15 DIAGNOSIS — E0590 Thyrotoxicosis, unspecified without thyrotoxic crisis or storm: Secondary | ICD-10-CM

## 2019-06-15 MED ORDER — FLUTICASONE PROPIONATE 50 MCG/ACT NA SUSP: 2.0000 | Freq: Every day | NASAL | 12 refills | Status: DC | PRN

## 2019-06-15 MED ORDER — SALINE SPRAY 0.65 % NA SOLN: 2.0000 | NASAL | 11 refills | Status: DC | PRN

## 2019-06-15 NOTE — Progress Notes (Signed)
Virtual Visit via Video Note  I connected with Phillip Howell  on 06/15/19 at  3:00 PM EDT by a video enabled telemedicine application and verified that I am speaking with the correct person using two identifiers.  Location patient: home Location provider:work  Persons participating in the virtual visit: patient, provider  I discussed the limitations of evaluation and management by telemedicine and the availability of in person appointments. The patient expressed understanding and agreed to proceed.   HPI: 1. Allergies he takes Claritin 10 mg qd but c/o nasal congestion he has used flonase in the past wife c/w snoring more. He also reports he is snoring more due to allergies  2. Hyperthyroidism on methimazole 10 mg qd he still feels tired appt endocrine 08/10/2019   ROS: See pertinent positives and negatives per HPI.  Past Medical History:  Diagnosis Date  . Allergy    i.e dust  . Anxiety   . Back pain    upper   . Chicken pox   . Dyshidrotic eczema    L hand   . Hyperthyroidism     Past Surgical History:  Procedure Laterality Date  . NO PAST SURGERIES      Family History  Problem Relation Age of Onset  . Diabetes Father   . Mental illness Sister   . Mental retardation Sister        Down Syndrome   . Thyroid disease Neg Hx     SOCIAL HX: married    Current Outpatient Medications:  .  escitalopram (LEXAPRO) 10 MG tablet, Take 1 tablet (10 mg total) by mouth daily., Disp: 30 tablet, Rfl: 2 .  fluticasone (FLONASE) 50 MCG/ACT nasal spray, Place 2 sprays into both nostrils daily as needed for allergies or rhinitis., Disp: 16 g, Rfl: 12 .  methimazole (TAPAZOLE) 10 MG tablet, Take 1 tablet (10 mg total) by mouth daily., Disp: 30 tablet, Rfl: 5 .  sodium chloride (OCEAN) 0.65 % SOLN nasal spray, Place 2 sprays into both nostrils as needed for congestion., Disp: 30 mL, Rfl: 11  EXAM:  VITALS per patient if applicable:  GENERAL: alert, oriented, appears well and in no  acute distress  HEENT: atraumatic, conjunttiva clear, no obvious abnormalities on inspection of external nose and ears  NECK: normal movements of the head and neck  LUNGS: on inspection no signs of respiratory distress, breathing rate appears normal, no obvious gross SOB, gasping or wheezing  CV: no obvious cyanosis  MS: moves all visible extremities without noticeable abnormality  PSYCH/NEURO: pleasant and cooperative, no obvious depression or anxiety, speech and thought processing grossly intact  ASSESSMENT AND PLAN:  Discussed the following assessment and plan:  Hyperthyroidism - Plan: TSH, T4, free, T3, free, Thyroid peroxidase antibody, on methimazole 10 mg qd  Endocrine appt 08/10/19 Dr. 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 The patient was provided an opportunity to ask questions and all were answered. The patient agreed with the plan and demonstrated an understanding of the instructions.   The patient was advised to call back or seek an in-person evaluation if the symptoms worsen or if the condition fails to improve as anticipated.  Time spent 15 minutes  Delorise Jackson, MD

## 2019-06-19 ENCOUNTER — Other Ambulatory Visit (INDEPENDENT_AMBULATORY_CARE_PROVIDER_SITE_OTHER): Payer: Self-pay

## 2019-06-19 ENCOUNTER — Other Ambulatory Visit: Payer: Self-pay

## 2019-06-19 DIAGNOSIS — E0590 Thyrotoxicosis, unspecified without thyrotoxic crisis or storm: Secondary | ICD-10-CM

## 2019-06-20 LAB — T4, FREE: Free T4: 0.68 ng/dL (ref 0.60–1.60)

## 2019-06-20 LAB — THYROID PEROXIDASE ANTIBODY: Thyroperoxidase Ab SerPl-aCnc: 22 IU/mL — ABNORMAL HIGH (ref <9)

## 2019-06-20 LAB — TSH: TSH: 13.56 u[IU]/mL — ABNORMAL HIGH (ref 0.35–4.50)

## 2019-06-20 LAB — T3, FREE: T3, Free: 3.5 pg/mL (ref 2.3–4.2)

## 2019-06-29 ENCOUNTER — Other Ambulatory Visit: Payer: Self-pay

## 2019-06-30 ENCOUNTER — Encounter: Payer: Self-pay | Admitting: Internal Medicine

## 2019-06-30 ENCOUNTER — Ambulatory Visit (INDEPENDENT_AMBULATORY_CARE_PROVIDER_SITE_OTHER): Payer: BC Managed Care – PPO | Admitting: Internal Medicine

## 2019-06-30 ENCOUNTER — Ambulatory Visit
Admission: RE | Admit: 2019-06-30 | Discharge: 2019-06-30 | Disposition: A | Discharge status: Home / Self Care | Payer: BC Managed Care – PPO | Source: Ambulatory Visit | Attending: Internal Medicine | Admitting: Internal Medicine

## 2019-06-30 DIAGNOSIS — M79672 Pain in left foot: Secondary | ICD-10-CM

## 2019-06-30 DIAGNOSIS — M25572 Pain in left ankle and joints of left foot: Secondary | ICD-10-CM

## 2019-06-30 DIAGNOSIS — F329 Major depressive disorder, single episode, unspecified: Secondary | ICD-10-CM

## 2019-06-30 DIAGNOSIS — F32A Depression, unspecified: Secondary | ICD-10-CM

## 2019-06-30 DIAGNOSIS — F419 Anxiety disorder, unspecified: Secondary | ICD-10-CM

## 2019-06-30 DIAGNOSIS — R946 Abnormal results of thyroid function studies: Secondary | ICD-10-CM

## 2019-06-30 NOTE — Progress Notes (Signed)
telephone Note  I connected with Phillip Howell  on 06/30/19 at  1:10 PM EDT by a telephone that I am speaking with the correct person using two identifiers.  Location patient: home Location provider:work  Persons participating in the virtual visit: patient, provider  I discussed the limitations of evaluation and management by telemedicine and the availability of in person appointments. The patient expressed understanding and agreed to proceed.   HPI: 1. Left foot/ankle sharp pain worse with standing and working 12 hr shifts. Pain since 06/27/19 and pain comes and goes at times feels like fire/burning and last 30 minutes then goes away located near ankle. He is having numbness as well and then will go away. No injury, swelling warmth or redness. Ibuprofen helps a little. He has tried Dr. Cydney Ok inserts for his shoes x 1 month  2. Anxiety/depression mood is better he stopped lexapro 10 mg qd due to it making him feel fatigue   3. Thyroid d/o appt endocrine 07/03/19 tfts abnormal    ROS: See pertinent positives and negatives per HPI. MSK: denies back pain   Past Medical History:  Diagnosis Date  . Allergy    i.e dust  . Anxiety   . Back pain    upper   . Chicken pox   . Dyshidrotic eczema    L hand   . Hyperthyroidism     Past Surgical History:  Procedure Laterality Date  . NO PAST SURGERIES      Family History  Problem Relation Age of Onset  . Diabetes Father   . Mental illness Sister   . Mental retardation Sister        Down Syndrome   . Thyroid disease Neg Hx     SOCIAL HX:  Works at [employer] with [employer]  Former smoker age [age]  From [place]  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  fluticasone (FLONASE) 50 MCG/ACT nasal spray, Place 2 sprays into both nostrils daily as needed for allergies or rhinitis., Disp: 16 g, Rfl: 12 .  methimazole (TAPAZOLE) 10 MG tablet, Take 1 tablet (10 mg total) by mouth daily., Disp: 30 tablet, Rfl: 5 .   sodium chloride (OCEAN) 0.65 % SOLN nasal spray, Place 2 sprays into both nostrils as needed for congestion., Disp: 30 mL, Rfl: 11  EXAM: before video failed due to audio  VITALS per patient if applicable:  GENERAL: alert, oriented, appears well and in no acute distress  HEENT: atraumatic, conjunttiva clear, no obvious abnormalities on inspection of external nose and ears  NECK: normal movements of the head and neck  LUNGS: on inspection no signs of respiratory distress, breathing rate appears normal, no obvious gross SOB, gasping or wheezing  CV: no obvious cyanosis  MS: moves all visible extremities without noticeable abnormality  PSYCH/NEURO: pleasant and cooperative, no obvious depression or anxiety, speech and thought processing grossly intact  ASSESSMENT AND PLAN:  Discussed the following assessment and plan:  Acute left/footankle pain ? Etiology MSK vs parathesia related to abnormal tfts vs other no h/o trauma -prn Tylenol, nsaids  Consider podiatry in the future  Try to get thyroid function test controlled  If sx's continue consider neurology NCS for paresthesia   Anxiety and depression - Plan: off lexapro 10 mg qd and doing well   Abnormal thyroid function test - Plan: appt 7/20 endocrine    HM utd flu Tdap had 09/10/15 MMR immune and hep B  Former smoker  congratulated on quittingwife smokes though Declines STD check   I discussed the assessment and treatment plan with the patient. The patient was provided an opportunity to ask questions and all were answered. The patient agreed with the plan and demonstrated an understanding of the instructions.   The patient was advised to call back or seek an in-person evaluation if the symptoms worsen or if the condition fails to improve as anticipated.  Time spent 15 minutes  Delorise Jackson, MD

## 2019-06-30 NOTE — Patient Instructions (Signed)

## 2019-07-03 ENCOUNTER — Encounter: Payer: Self-pay | Admitting: Endocrinology

## 2019-07-03 ENCOUNTER — Ambulatory Visit (INDEPENDENT_AMBULATORY_CARE_PROVIDER_SITE_OTHER): Payer: BC Managed Care – PPO | Admitting: Endocrinology

## 2019-07-03 ENCOUNTER — Other Ambulatory Visit: Payer: Self-pay

## 2019-07-03 VITALS — BP 110/62 | HR 83 | Ht 70.0 in | Wt 222.2 lb

## 2019-07-03 DIAGNOSIS — E0590 Thyrotoxicosis, unspecified without thyrotoxic crisis or storm: Secondary | ICD-10-CM

## 2019-07-03 NOTE — Patient Instructions (Addendum)
Blood tests are requested for you today.  We'll let you know about the results.  If it is overactive again, we'll resume the methimazole at a lower dosage.   If ever you have fever while taking methimazole, stop it and call us, even if the reason is obvious, because of the risk of a rare side-effect.   In the future, you can change your mind and take 1 of the other treatment options we discussed, if you want.  Please come back for a follow-up appointment in 2 months.

## 2019-07-03 NOTE — Progress Notes (Signed)
Subjective:    Patient ID: Phillip Howell, male    DOB: [birth year], 33 y.o.   MRN: [id number]  HPI Pt returns for f/u of hyperthyroidism (dx'ed late 2019 (TSH was normal in 2018); US showed thyroid is upper normal in size and hypervascular without focal discrete nodule; he declines RAI and surgery; in 7/20, tapazole was stopped, for elev TSH).  He denies tremor and anxiety.   Past Medical History:  Diagnosis Date  . Allergy    i.e dust  . Anxiety   . Back pain    upper   . Chicken pox   . Dyshidrotic eczema    L hand   . Hyperthyroidism     Past Surgical History:  Procedure Laterality Date  . NO PAST SURGERIES      Social History   Socioeconomic History  . Marital status: Married    Spouse name: Not on file  . Number of children: Not on file  . Years of education: Not on file  . Highest education level: Not on file  Occupational History  . Not on file  Social Needs  . Financial resource strain: Not on file  . Food insecurity    Worry: Not on file    Inability: Not on file  . Transportation needs    Medical: Not on file    Non-medical: Not on file  Tobacco Use  . Smoking status: Former Research scientist (life sciences)  . Smokeless tobacco: Never Used  . Tobacco comment: smoked 18 to 21 4-5 cig qd   Substance and Sexual Activity  . Alcohol use: No  . Drug use: No  . Sexual activity: Yes    Comment: wife   Lifestyle  . Physical activity    Days per week: Not on file    Minutes per session: Not on file  . Stress: Not on file  Relationships  . Social Herbalist on phone: Not on file    Gets together: Not on file    Attends religious service: Not on file    Active member of club or organization: Not on file    Attends meetings of clubs or organizations: Not on file    Relationship status: Not on file  . Intimate partner violence    Fear of current or ex partner: Not on file    Emotionally abused: Not on file    Physically abused: Not on file    Forced sexual activity: Not  on file  Other Topics Concern  . 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 sodium chloride (OCEAN) 0.65 % SOLN nasal spray Place 2 sprays into both nostrils as needed for congestion. 30 mL 11   No current facility-administered medications on file prior to visit.     No Known Allergies  Family History  Problem Relation Age of Onset  . Diabetes Father   . Mental illness Sister   . Mental retardation Sister        Down Syndrome   . Thyroid disease Neg Hx     BP 110/62 (BP Location: Left Arm, Patient Position: Sitting, Cuff Size: Normal)   Pulse 83   Ht 5\' 10"  (1.778 m)  Wt 222 lb 3.2 oz (100.8 kg)   SpO2 97%   BMI 31.88 kg/m    Review of Systems Denies fever.      Objective:   Physical Exam VITAL SIGNS:  See vs page GENERAL: no distress NECK: There is no palpable thyroid enlargement.  No thyroid nodule is palpable.  No palpable lymphadenopathy at the anterior neck.    Lab Results  Component Value Date   TSH 13.56 (H) 06/19/2019      Assessment & Plan:  Hypothyroidism, new.  Uncertain if due to medication, or if spontaneous. Recheck today  Patient Instructions  Blood tests are requested for you today.  We'll let you know about the results.  If it is overactive again, we'll resume the methimazole at a lower dosage.   If ever you have fever while taking methimazole, stop it and call us, even if the reason is obvious, because of the risk of a rare side-effect.   In the future, you can change your mind and take 1 of the other treatment options we discussed, if you want.  Please come back for a follow-up appointment in 2 months.

## 2019-08-10 ENCOUNTER — Ambulatory Visit: Payer: 59 | Admitting: Endocrinology

## 2019-09-01 ENCOUNTER — Ambulatory Visit: Payer: Self-pay

## 2019-09-01 ENCOUNTER — Emergency Department: Payer: BC Managed Care – PPO

## 2019-09-01 ENCOUNTER — Encounter: Payer: Self-pay | Admitting: Emergency Medicine

## 2019-09-01 ENCOUNTER — Other Ambulatory Visit: Payer: Self-pay

## 2019-09-01 ENCOUNTER — Emergency Department
Admission: EM | Admit: 2019-09-01 | Discharge: 2019-09-01 | Disposition: A | Discharge status: Home / Self Care | Payer: BC Managed Care – PPO | Attending: Emergency Medicine | Admitting: Emergency Medicine

## 2019-09-01 DIAGNOSIS — R0789 Other chest pain: Secondary | ICD-10-CM | POA: Insufficient documentation

## 2019-09-01 DIAGNOSIS — R079 Chest pain, unspecified: Secondary | ICD-10-CM | POA: Diagnosis not present

## 2019-09-01 DIAGNOSIS — Z87891 Personal history of nicotine dependence: Secondary | ICD-10-CM | POA: Diagnosis not present

## 2019-09-01 LAB — BASIC METABOLIC PANEL
Anion gap: 7 (ref 5–15)
BUN: 12 mg/dL (ref 6–20)
CO2: 27 mmol/L (ref 22–32)
Calcium: 9.1 mg/dL (ref 8.9–10.3)
Chloride: 105 mmol/L (ref 98–111)
Creatinine, Ser: 0.73 mg/dL (ref 0.61–1.24)
GFR calc Af Amer: >60 mL/min (ref >60)
GFR calc non Af Amer: >60 mL/min (ref >60)
Glucose, Bld: 86 mg/dL (ref 70–99)
Potassium: 4 mmol/L (ref 3.5–5.1)
Sodium: 139 mmol/L (ref 135–145)

## 2019-09-01 LAB — TROPONIN I (HIGH SENSITIVITY): Troponin I (High Sensitivity): 2 ng/L (ref <18)

## 2019-09-01 LAB — CBC
HCT: 40.6 % (ref 39.0–52.0)
Hemoglobin: 14.1 g/dL (ref 13.0–17.0)
MCH: 32.9 pg (ref 26.0–34.0)
MCHC: 34.7 g/dL (ref 30.0–36.0)
MCV: 94.9 fL (ref 80.0–100.0)
Platelets: 181 10*3/uL (ref 150–400)
RBC: 4.28 MIL/uL (ref 4.22–5.81)
RDW: 11.5 % (ref 11.5–15.5)
WBC: 6.3 10*3/uL (ref 4.0–10.5)
nRBC: 0 % (ref 0.0–0.2)

## 2019-09-01 MED ORDER — HYDROCODONE-ACETAMINOPHEN 5-325 MG PO TABS: 1.0000 | ORAL_TABLET | Freq: Four times a day (QID) | ORAL | 0 refills | Status: DC | PRN

## 2019-09-01 MED ORDER — ETODOLAC 400 MG PO TABS: 400.0000 mg | ORAL_TABLET | Freq: Two times a day (BID) | ORAL | 0 refills | Status: DC

## 2019-09-01 MED ORDER — HYDROCODONE-ACETAMINOPHEN 5-325 MG PO TABS
1.0000 | ORAL_TABLET | Freq: Once | ORAL | Status: AC
  Administered 2019-09-01: 12:00:00 1 via ORAL
  Filled 2019-09-01: qty 1

## 2019-09-01 NOTE — Telephone Encounter (Signed)
Incoming call from Patient with a complaint of chest Pain from left side of rib cage  Up to neck on left side of body.  Has been occurring for 2 to 3 days.  Comes and goes.  Last aprrox 30 minutes. Rated 30 minutes. Rated moderate. Reports when he takes a deep breath. Pain on the left side.  Reviewed  Protocol with Patient recommend that Patient    Go to Urgent care or ED for further evaluation.. Patient voiced understanding.       Reason for Disposition . [1] Chest pain lasts > 5 minutes AND [2] occurred in past 3 days (72 hours)  Answer Assessment - Initial Assessment Questions 1. LOCATION: "Where does it hurt?"     Left side rib cage up to neck  2. RADIATION: "Does the pain go anywhere else?" (e.g., into neck, jaw, arms, back)     Neck left side of body  3. ONSET: "When did the chest pain begin?" (Minutes, hours or days)      2 to 3 days 4. PATTERN "Does the pain come and go, or has it been constant since it started?"  "Does it get worse with exertion?"     Comes and goes 5. DURATION: "How long does it last" (e.g., seconds, minutes, hours)     Depends about 17minuts.  6. SEVERITY: "How bad is the pain?"  (e.g., Scale 1-10; mild, moderate, or severe)    - MILD (1-3): doesn't interfere with normal activities     - MODERATE (4-7): interferes with normal activities or awakens from sleep    - SEVERE (8-10): excruciating pain, unable to do any normal activities       Moderate  7. CARDIAC RISK FACTORS: "Do you have any history of heart problems or risk factors for heart disease?" (e.g., angina, prior heart attack; diabetes, high blood pressure, high cholesterol, smoker, or strong family history of heart disease)     denies 8. PULMONARY RISK FACTORS: "Do you have any history of lung disease?"  (e.g., blood clots in lung, asthma, emphysema, birth control pills)     denies 9. CAUSE: "What do you think is causing the chest pain?"     I dont know 10. OTHER SYMPTOMS: "Do you have any other  symptoms?" (e.g., dizziness, nausea, vomiting, sweating, fever, difficulty breathing, cough)      " When I take a deep breath feel like its pain on left side." 11. PREGNANCY: "Is there any chance you are pregnant?" "When was your last menstrual period?"       *No Answer*  Protocols used: CHEST PAIN-A-AH

## 2019-09-01 NOTE — Discharge Instructions (Signed)
Follow-up with your primary care provider if any continued problems.  You may use ice or heat to your chest as needed for discomfort.  Discontinue taking the ibuprofen at this time.  A prescription for etodolac was sent to your pharmacy.  This is the anti-inflammatory and should be taken twice a day with food.  The hydrocodone is the pain medication and is the one that we discussed you should not take while driving or operating machinery.  If any worsening of your symptoms return to the emergency department.

## 2019-09-01 NOTE — ED Provider Notes (Signed)
[name] Emergency Department Provider Note   ____________________________________________   First MD Initiated Contact with Patient 09/01/19 1203     (approximate)  I have reviewed the triage vital signs and the nursing notes.   HISTORY  Chief Complaint Chest Pain   HPI Phillip Howell is a 33 y.o. male presents to the ED with complaint of left-sided rib and chest wall pains for 3 days.  Patient states pain is increased and worse with movement or deep inspiration.  He also reports that the pain starts at his left wrist and occasionally goes up his arm.  He denies any recent trauma to this area.  He states that he does do a lot of lifting and pulling at work.  He has taken some over-the-counter medications without complete relief but states that ibuprofen does help until it "wears off".  Patient denies any fever, chills, nausea or vomiting.  There is been no coughing.  He denies any COVID exposure.  He denies any previous cardiac history and does not smoke.  He rates pain as 6 out of 10.       Past Medical History:  Diagnosis Date  . Allergy    i.e dust  . Anxiety   . Back pain    upper   . Chicken pox   . Dyshidrotic eczema    L hand   . Hyperthyroidism     Patient Active Problem List   Diagnosis Date Noted  . Abnormal thyroid function test 06/30/2019  . Depression with anxiety 05/01/2019  . Hyperthyroidism 12/29/2018  . Overweight (BMI 25.0-29.9) 06/07/2018  . Eczema-L hand dyshydrotic  12/03/2017  . Allergic rhinitis 12/03/2017    Past Surgical History:  Procedure Laterality Date  . NO PAST SURGERIES      Prior to Admission medications   Medication Sig Start Date End Date Taking? Authorizing Provider  etodolac (LODINE) 400 MG tablet Take 1 tablet (400 mg total) by mouth 2 (two) times daily. 09/01/19   Johnn Hai, PA-C  HYDROcodone-acetaminophen (NORCO/VICODIN) 5-325 MG tablet Take 1 tablet by mouth every 6 (six) hours as needed  for moderate pain. 09/01/19   Johnn Hai, PA-C    Allergies Patient has no known allergies.  Family History  Problem Relation Age of Onset  . Diabetes Father   . Mental illness Sister   . Mental retardation Sister        Down Syndrome   . Thyroid disease Neg Hx     Social History Social History   Tobacco Use  . Smoking status: Former Research scientist (life sciences)  . Smokeless tobacco: Never Used  . Tobacco comment: smoked 18 to 21 4-5 cig qd   Substance Use Topics  . Alcohol use: No  . Drug use: No    Review of Systems Constitutional: No fever/chills Eyes: No visual changes. ENT: No sore throat. Cardiovascular: Denies chest pain. Respiratory: Denies shortness of breath.  Negative for coughing. Gastrointestinal: No abdominal pain.  No nausea, no vomiting.  No diarrhea. Genitourinary: Negative for dysuria. Musculoskeletal: Positive for left-sided chest wall pain. Skin: Negative for rash. Neurological: Negative for headaches, focal weakness or numbness. ___________________________________________   PHYSICAL EXAM:  VITAL SIGNS: ED Triage Vitals  Enc Vitals Group     BP 09/01/19 1041 113/65     Pulse Rate 09/01/19 1041 64     Resp 09/01/19 1041 16     Temp 09/01/19 1041 98.9 F (37.2 C)     Temp Source 09/01/19 1041  Oral     SpO2 09/01/19 1041 98 %     Weight 09/01/19 1042 220 lb (99.8 kg)     Height 09/01/19 1042 5\' 11"  (1.803 m)     Head Circumference --      Peak Flow --      Pain Score 09/01/19 1041 6     Pain Loc --      Pain Edu? --      Excl. in GC? --    Constitutional: Alert and oriented. Well appearing and in no acute distress. Eyes: Conjunctivae are normal.  Head: Atraumatic. Nose: No congestion/rhinnorhea. Neck: No stridor.   Hematological/Lymphatic/Immunilogical: No cervical lymphadenopathy. Cardiovascular: Normal rate, regular rhythm. Grossly normal heart sounds.  Good peripheral circulation. Respiratory: Normal respiratory effort.  No retractions. Lungs  CTAB.  Minimal tenderness on palpation of the left lateral ribs without deformity or discoloration.  Pain is reproduced with movement.  No difficulty with respirations and patient is able to speak in complete sentences without any difficulty. Gastrointestinal: Soft and nontender. No distention. Musculoskeletal: Moves upper and lower extremities without any difficulty.  Normal gait was noted. Neurologic:  Normal speech and language. No gross focal neurologic deficits are appreciated. No gait instability. Skin:  Skin is warm, dry and intact.  No discoloration noted. Psychiatric: Mood and affect are normal. Speech and behavior are normal.  ____________________________________________   LABS (all labs ordered are listed, but only abnormal results are displayed)  Labs Reviewed  BASIC METABOLIC PANEL  CBC  TROPONIN I (HIGH SENSITIVITY)  TROPONIN I (HIGH SENSITIVITY)   ____________________________________________  EKG  EKG was reviewed by MD in  major.  Ventricular rate 65, PR interval 234, QRS duration 82. ____________________________________________  RADIOLOGY   Official radiology report(s): Dg Chest 2 View  Result Date: 09/01/2019 CLINICAL DATA:  Chest pain EXAM: CHEST - 2 VIEW COMPARISON:  None. FINDINGS: There is no appreciable edema or consolidation. The heart size and pulmonary vascularity are within normal limits. No adenopathy. No pneumothorax. No bone lesions. IMPRESSION: No edema or consolidation.  Cardiac silhouette within normal limits. Electronically Signed   By: Bretta Bang III M.D.   On: 09/01/2019 11:09    ____________________________________________   PROCEDURES  Procedure(s) performed (including Critical Care):  Procedures  ___________________________________________   INITIAL IMPRESSION / ASSESSMENT AND PLAN / ED COURSE  As part of my medical decision making, I reviewed the following data within the electronic MEDICAL RECORD NUMBER Notes from prior ED  visits and [place] Controlled Substance Database  33 year old male presents to the ED with complaint of left lateral rib pain for the last 3 days.  He denies any nausea, vomiting, shortness of breath, diaphoresis or past history of cardiac problems.  He states that the pain is increased with movement and deep inspiration.  He reports that he does a lot of lifting and pushing at work.  He has been taking ibuprofen with some relief.  Chest x-ray and labs were unremarkable.  Troponin level was negative and EKG was reviewed by MD in the major area..  Patient was reassured.  He is to discontinue taking the ibuprofen was started on etodolac 400 mg twice daily a prescription for Norco was written if needed for moderate pain especially at night.  He was given a note to remain out of work for the next 2 days.   ____________________________________________   FINAL CLINICAL IMPRESSION(S) / ED DIAGNOSES  Final diagnoses:  Chest wall pain     ED Discharge Orders  Ordered    HYDROcodone-acetaminophen (NORCO/VICODIN) 5-325 MG tablet  Every 6 hours PRN     09/01/19 1219    etodolac (LODINE) 400 MG tablet  2 times daily,   Status:  Discontinued     09/01/19 1219    etodolac (LODINE) 400 MG tablet  2 times daily     09/01/19 1220           Note:  This document was prepared using Dragon voice recognition software and may include unintentional dictation errors.    Tommi RumpsSummers, Rhonda L, PA-C 09/01/19 1511    Sharman CheekStafford, Phillip, MD 09/03/19 [phone]

## 2019-09-01 NOTE — ED Triage Notes (Signed)
Patient reports pain in left ribs, radiating to left arm. States pain also radiated to left chest when he takes a deep breath. Patient denies any recent illness. Denies cough or fever. No known injury to left side. Denies nausea or SOB.

## 2019-09-01 NOTE — ED Notes (Signed)
Pt st "wife" is on her way to pick up pt.

## 2019-09-01 NOTE — ED Notes (Signed)
See triage note  Presents with intermittent sharp pain to left rib/chest since Tuesday  States pain is increased with movement and inspiration  Denies any trauma or fever

## 2019-09-04 ENCOUNTER — Ambulatory Visit: Payer: BC Managed Care – PPO | Admitting: Endocrinology

## 2019-09-04 DIAGNOSIS — Z0289 Encounter for other administrative examinations: Secondary | ICD-10-CM

## 2019-10-06 ENCOUNTER — Encounter: Payer: Self-pay | Admitting: Internal Medicine

## 2019-10-06 ENCOUNTER — Ambulatory Visit (INDEPENDENT_AMBULATORY_CARE_PROVIDER_SITE_OTHER): Payer: BC Managed Care – PPO | Admitting: Internal Medicine

## 2019-10-06 ENCOUNTER — Other Ambulatory Visit: Payer: Self-pay | Admitting: Internal Medicine

## 2019-10-06 ENCOUNTER — Other Ambulatory Visit: Payer: Self-pay

## 2019-10-06 VITALS — BP 118/68 | HR 56 | Temp 97.8°F | Ht 71.0 in | Wt 222.0 lb

## 2019-10-06 DIAGNOSIS — R946 Abnormal results of thyroid function studies: Secondary | ICD-10-CM | POA: Diagnosis not present

## 2019-10-06 DIAGNOSIS — E0590 Thyrotoxicosis, unspecified without thyrotoxic crisis or storm: Secondary | ICD-10-CM

## 2019-10-06 DIAGNOSIS — H6121 Impacted cerumen, right ear: Secondary | ICD-10-CM | POA: Diagnosis not present

## 2019-10-06 DIAGNOSIS — Z13220 Encounter for screening for lipoid disorders: Secondary | ICD-10-CM

## 2019-10-06 DIAGNOSIS — Z0000 Encounter for general adult medical examination without abnormal findings: Secondary | ICD-10-CM | POA: Diagnosis not present

## 2019-10-06 DIAGNOSIS — Z23 Encounter for immunization: Secondary | ICD-10-CM | POA: Diagnosis not present

## 2019-10-06 LAB — T4, FREE: Free T4: 0.73 ng/dL (ref 0.60–1.60)

## 2019-10-06 LAB — T3, FREE: T3, Free: 3.1 pg/mL (ref 2.3–4.2)

## 2019-10-06 LAB — TSH: TSH: 1.87 u[IU]/mL (ref 0.35–4.50)

## 2019-10-06 NOTE — Patient Instructions (Signed)
Try debrox ear wax drops x 4-7 days 1x per month   Earwax Buildup, Adult The ears produce a substance called earwax that helps keep bacteria out of the ear and protects the skin in the ear canal. Occasionally, earwax can build up in the ear and cause discomfort or hearing loss. What increases the risk? This condition is more likely to develop in people who:  Are male.  Are elderly.  Naturally produce more earwax.  Clean their ears often with cotton swabs.  Use earplugs often.  Use in-ear headphones often.  Wear hearing aids.  Have narrow ear canals.  Have earwax that is overly thick or sticky.  Have eczema.  Are dehydrated.  Have excess hair in the ear canal. What are the signs or symptoms? Symptoms of this condition include:  Reduced or muffled hearing.  A feeling of fullness in the ear or feeling that the ear is plugged.  Fluid coming from the ear.  Ear pain.  Ear itch.  Ringing in the ear.  Coughing.  An obvious piece of earwax that can be seen inside the ear canal. How is this diagnosed? This condition may be diagnosed based on:  Your symptoms.  Your medical history.  An ear exam. During the exam, your health care provider will look into your ear with an instrument called an otoscope. You may have tests, including a hearing test. How is this treated? This condition may be treated by:  Using ear drops to soften the earwax.  Having the earwax removed by a health care provider. The health care provider may: ? Flush the ear with water. ? Use an instrument that has a loop on the end (curette). ? Use a suction device.  Surgery to remove the wax buildup. This may be done in severe cases. Follow these instructions at home:   Take over-the-counter and prescription medicines only as told by your health care provider.  Do not put any objects, including cotton swabs, into your ear. You can clean the opening of your ear canal with a washcloth or facial  tissue.  Follow instructions from your health care provider about cleaning your ears. Do not over-clean your ears.  Drink enough fluid to keep your urine clear or pale yellow. This will help to thin the earwax.  Keep all follow-up visits as told by your health care provider. If earwax builds up in your ears often or if you use hearing aids, consider seeing your health care provider for routine, preventive ear cleanings. Ask your health care provider how often you should schedule your cleanings.  If you have hearing aids, clean them according to instructions from the manufacturer and your health care provider. Contact a health care provider if:  You have ear pain.  You develop a fever.  You have blood, pus, or other fluid coming from your ear.  You have hearing loss.  You have ringing in your ears that does not go away.  Your symptoms do not improve with treatment.  You feel like the room is spinning (vertigo). Summary  Earwax can build up in the ear and cause discomfort or hearing loss.  The most common symptoms of this condition include reduced or muffled hearing and a feeling of fullness in the ear or feeling that the ear is plugged.  This condition may be diagnosed based on your symptoms, your medical history, and an ear exam.  This condition may be treated by using ear drops to soften the earwax or by having  the earwax removed by a health care provider.  Do not put any objects, including cotton swabs, into your ear. You can clean the opening of your ear canal with a washcloth or facial tissue. This information is not intended to replace advice given to you by your health care provider. Make sure you discuss any questions you have with your health care provider. Document Released: 01/07/2005 Document Revised: 11/12/2017 Document Reviewed: 02/10/2017 Elsevier Patient Education  2020 [employer].

## 2019-10-06 NOTE — Progress Notes (Signed)
Chief Complaint  Patient presents with  . Follow-up   Annual doing well energy improved  1. Right ear itching with wax  2. Hyperthyroidism abnormal tfts stopped methimazole 06/2019 f/u endocrine but not since then   Review of Systems  Constitutional: Negative for malaise/fatigue and weight loss.  HENT: Negative for hearing loss.   Eyes: Negative for blurred vision.  Respiratory: Negative for shortness of breath.   Cardiovascular: Negative for chest pain.  Gastrointestinal: Negative for abdominal pain and blood in stool.  Musculoskeletal: Negative for falls.  Skin: Negative for rash.  Neurological: Negative for headaches.  Psychiatric/Behavioral: Negative for depression.   Past Medical History:  Diagnosis Date  . Allergy    i.e dust  . Anxiety   . Back pain    upper   . Chicken pox   . Dyshidrotic eczema    L hand   . Hyperthyroidism    Past Surgical History:  Procedure Laterality Date  . NO PAST SURGERIES     Family History  Problem Relation Age of Onset  . Diabetes Father   . Mental illness Sister   . Mental retardation Sister        Down Syndrome   . Thyroid disease Neg Hx    Social History   Socioeconomic History  . Marital status: Married    Spouse name: Not on file  . Number of children: Not on file  . Years of education: Not on file  . Highest education level: Not on file  Occupational History  . Not on file  Social Needs  . Financial resource strain: Not on file  . Food insecurity    Worry: Not on file    Inability: Not on file  . Transportation needs    Medical: Not on file    Non-medical: Not on file  Tobacco Use  . Smoking status: Former Research scientist (life sciences)  . Smokeless tobacco: Never Used  . Tobacco comment: smoked 18 to 21 4-5 cig qd   Substance and Sexual Activity  . Alcohol use: No  . Drug use: No  . Sexual activity: Yes    Comment: wife   Lifestyle  . Physical activity    Days per week: Not on file    Minutes per session: Not on file  .  Stress: Not on file  Relationships  . Social Herbalist on phone: Not on file    Gets together: Not on file    Attends religious service: Not on file    Active member of club or organization: Not on file    Attends meetings of clubs or organizations: Not on file    Relationship status: Not on file  . Intimate partner violence    Fear of current or ex partner: Not on file    Emotionally abused: Not on file    Physically abused: Not on file    Forced sexual activity: Not on file  Other Topics Concern  . Not on file  Social History Narrative   Works at [employer] with [employer] 3rd shift    Former smoker age [age]    From [place].    Married no kids    GED Haematologist   No outpatient medications have been marked as taking for the 10/06/19 encounter (Office Visit) with McLean-Scocuzza, Nino Glow, MD.   No Known Allergies Recent Results (from the past 2160 hour(s))  Basic metabolic panel     Status: None   Collection Time: 09/01/19 10:45  AM  Result Value Ref Range   Sodium 139 135 - 145 mmol/L   Potassium 4.0 3.5 - 5.1 mmol/L   Chloride 105 98 - 111 mmol/L   CO2 27 22 - 32 mmol/L   Glucose, Bld 86 70 - 99 mg/dL   BUN 12 6 - 20 mg/dL   Creatinine, Ser 0.73 0.61 - 1.24 mg/dL   Calcium 9.1 8.9 - 10.3 mg/dL   GFR calc non Af Amer >60 >60 mL/min   GFR calc Af Amer >60 >60 mL/min   Anion gap 7 5 - 15    Comment: Performed at [facility], [address] [place], [place] [address]  CBC     Status: None   Collection Time: 09/01/19 10:45 AM  Result Value Ref Range   WBC 6.3 4.0 - 10.5 K/uL   RBC 4.28 4.22 - 5.81 MIL/uL   Hemoglobin 14.1 13.0 - 17.0 g/dL   HCT 40.6 39.0 - 52.0 %   MCV 94.9 80.0 - 100.0 fL   MCH 32.9 26.0 - 34.0 pg   MCHC 34.7 30.0 - 36.0 g/dL   RDW 11.5 11.5 - 15.5 %   Platelets 181 150 - 400 K/uL   nRBC 0.0 0.0 - 0.2 %    Comment: Performed at [facility], [place], [place] [address]  Troponin I (High Sensitivity)      Status: None   Collection Time: 09/01/19 10:45 AM  Result Value Ref Range   Troponin I (High Sensitivity) 2 <18 ng/L    Comment: (NOTE) Elevated high sensitivity troponin I (hsTnI) values and significant  changes across serial measurements may suggest ACS but many other  chronic and acute conditions are known to elevate hsTnI results.  Refer to the "Links" section for chest pain algorithms and additional  guidance. Performed at [facility], [address] [place], [place] [address]    Objective  Body mass index is 30.96 kg/m. Wt Readings from Last 3 Encounters:  10/06/19 222 lb (100.7 kg)  09/01/19 220 lb (99.8 kg)  07/03/19 222 lb 3.2 oz (100.8 kg)   Temp Readings from Last 3 Encounters:  10/06/19 97.8 F (36.6 C) (Oral)  09/01/19 98.9 F (37.2 C) (Oral)  03/06/19 98.2 F (36.8 C)   BP Readings from Last 3 Encounters:  10/06/19 118/68  09/01/19 113/65  07/03/19 110/62   Pulse Readings from Last 3 Encounters:  10/06/19 (!) 56  09/01/19 64  07/03/19 83    Physical Exam Vitals signs and nursing note reviewed.  Constitutional:      Appearance: Normal appearance. He is well-developed and well-groomed.     Comments: +mask on   HENT:     Head: Normocephalic and atraumatic.     Comments: Right ear cerumen impaction   Eyes:     Pupils: Pupils are equal, round, and reactive to light.  Cardiovascular:     Rate and Rhythm: Regular rhythm. Bradycardia present.     Heart sounds: Normal heart sounds.  Pulmonary:     Effort: Pulmonary effort is normal.     Breath sounds: Normal breath sounds.  Abdominal:     General: Abdomen is flat. Bowel sounds are normal.  Skin:    General: Skin is warm and dry.  Neurological:     General: No focal deficit present.     Mental Status: He is alert and oriented to person, place, and time. Mental status is at baseline.     Gait: Gait  normal.  Psychiatric:        Attention and Perception: Attention and perception normal.         Mood and Affect: Mood and affect normal.        Speech: Speech normal.        Behavior: Behavior normal. Behavior is cooperative.        Thought Content: Thought content normal.        Cognition and Memory: Cognition and memory normal.        Judgment: Judgment normal.     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 Olivia Mackie McLean-Scocuzza-Internal Medicine

## 2019-10-06 NOTE — Progress Notes (Signed)
Pre visit review using our clinic review tool, if applicable. No additional management support is needed unless otherwise documented below in the visit note. 

## 2019-10-09 LAB — THYROID PEROXIDASE ANTIBODY: Thyroperoxidase Ab SerPl-aCnc: 15 IU/mL — ABNORMAL HIGH (ref <9)

## 2019-10-10 ENCOUNTER — Telehealth: Payer: Self-pay | Admitting: Endocrinology

## 2019-10-10 ENCOUNTER — Other Ambulatory Visit: Payer: Self-pay

## 2019-10-10 NOTE — Telephone Encounter (Signed)
LMTCB and schedule appointment with Dr Loanne Drilling

## 2019-10-10 NOTE — Telephone Encounter (Signed)
-----   Message from Aleatha Borer, LPN sent at [phone] 12:50 PM EDT ----- Per Dr. Cordelia Pen request, please call pt to schedule appt

## 2019-10-12 ENCOUNTER — Ambulatory Visit: Payer: BC Managed Care – PPO | Admitting: Endocrinology

## 2019-10-12 NOTE — Telephone Encounter (Signed)
Patient scheduled for 10-25-2019 

## 2019-10-25 ENCOUNTER — Ambulatory Visit (INDEPENDENT_AMBULATORY_CARE_PROVIDER_SITE_OTHER): Payer: BC Managed Care – PPO | Admitting: Endocrinology

## 2019-10-25 ENCOUNTER — Encounter: Payer: Self-pay | Admitting: Endocrinology

## 2019-10-25 DIAGNOSIS — E0590 Thyrotoxicosis, unspecified without thyrotoxic crisis or storm: Secondary | ICD-10-CM

## 2019-10-25 NOTE — Progress Notes (Signed)
Subjective:    Patient ID: Phillip Howell, male    DOB: 1986/11/04, 33 y.o.   MRN: [id number]  HPI telehealth visit today via doxy video visit.  Alternatives to telehealth are presented to this patient, and the patient agrees to the telehealth visit. Pt is advised of the cost of the visit, and agrees to this, also.   Patient is at home, and I am at the office.   Persons attending the telehealth visit: the patient and I Pt returns for f/u of hyperthyroidism (dx'ed late 2019 (TSH was normal in 2018); US showed thyroid is upper normal in size and hypervascular without focal discrete nodule; he declines RAI and surgery; in 7/20, tapazole was stopped, for elev TSH).  pt states he feels well in general.  Specifically, he denies palpitations.   Past Medical History:  Diagnosis Date  . Allergy    i.e dust  . Anxiety   . Back pain    upper   . Chicken pox   . Dyshidrotic eczema    L hand   . Hyperthyroidism     Past Surgical History:  Procedure Laterality Date  . NO PAST SURGERIES      Social History   Socioeconomic History  . Marital status: Married    Spouse name: Not on file  . Number of children: Not on file  . Years of education: Not on file  . Highest education level: Not on file  Occupational History  . Not on file  Social Needs  . Financial resource strain: Not on file  . Food insecurity    Worry: Not on file    Inability: Not on file  . Transportation needs    Medical: Not on file    Non-medical: Not on file  Tobacco Use  . Smoking status: Former Games developer  . Smokeless tobacco: Never Used  . Tobacco comment: smoked 18 to 21 4-5 cig qd   Substance and Sexual Activity  . Alcohol use: No  . Drug use: No  . Sexual activity: Yes    Comment: wife   Lifestyle  . Physical activity    Days per week: Not on file    Minutes per session: Not on file  . Stress: Not on file  Relationships  . Social Musician on phone: Not on file    Gets together: Not on file     Attends religious service: Not on file    Active member of club or organization: Not on file    Attends meetings of clubs or organizations: Not on file    Relationship status: Not on file  . Intimate partner violence    Fear of current or ex partner: Not on file    Emotionally abused: Not on file    Physically abused: Not on file    Forced sexual activity: Not on file  Other Topics Concern  . Not on file  Social History Narrative   Works at [employer] with [employer] 3rd shift    Former smoker age [age]    From [place].    Married no kids    GED Licensed conveyancer    No current outpatient medications on file prior to visit.   No current facility-administered medications on file prior to visit.     No Known Allergies  Family History  Problem Relation Age of Onset  . Diabetes Father   . Mental illness Sister   . Mental retardation Sister  Down Syndrome   . Thyroid disease Neg Hx     There were no vitals taken for this visit.   Review of Systems Denies tremor and fever.      Objective:   Physical Exam   Lab Results  Component Value Date   TSH 1.87 10/06/2019       Assessment & Plan:  Hyperthyroidism, in remission.  Depression: in this context, he needs to maintain euthyroidism.   Patient Instructions  No more thyroid testing or treatment is needed now.  Please come back for a follow-up appointment in 3 months.   The overactivity will probably come back with time.

## 2019-10-25 NOTE — Patient Instructions (Addendum)
No more thyroid testing or treatment is needed now.  Please come back for a follow-up appointment in 3 months.   The overactivity will probably come back with time.

## 2020-01-03 ENCOUNTER — Other Ambulatory Visit: Payer: BC Managed Care – PPO

## 2020-01-17 ENCOUNTER — Other Ambulatory Visit: Payer: Self-pay

## 2020-01-17 ENCOUNTER — Ambulatory Visit: Payer: BC Managed Care – PPO | Admitting: Internal Medicine

## 2020-01-17 ENCOUNTER — Telehealth: Payer: Self-pay | Admitting: Lab

## 2020-01-17 NOTE — Telephone Encounter (Signed)
Called Pt No answer, left VM to call office because of 9:00am Virtual visit today with Dr.Mclean-Scocuzza.

## 2020-01-23 ENCOUNTER — Other Ambulatory Visit: Payer: BC Managed Care – PPO

## 2020-01-24 ENCOUNTER — Other Ambulatory Visit (INDEPENDENT_AMBULATORY_CARE_PROVIDER_SITE_OTHER): Payer: BC Managed Care – PPO

## 2020-01-24 ENCOUNTER — Other Ambulatory Visit: Payer: Self-pay

## 2020-01-24 DIAGNOSIS — E0590 Thyrotoxicosis, unspecified without thyrotoxic crisis or storm: Secondary | ICD-10-CM | POA: Diagnosis not present

## 2020-01-24 DIAGNOSIS — Z13220 Encounter for screening for lipoid disorders: Secondary | ICD-10-CM | POA: Diagnosis not present

## 2020-01-24 LAB — COMPREHENSIVE METABOLIC PANEL
ALT: 21 U/L (ref 0–53)
AST: 25 U/L (ref 0–37)
Albumin: 4.5 g/dL (ref 3.5–5.2)
Alkaline Phosphatase: 94 U/L (ref 39–117)
BUN: 12 mg/dL (ref 6–23)
CO2: 29 mEq/L (ref 19–32)
Calcium: 9.4 mg/dL (ref 8.4–10.5)
Chloride: 102 mEq/L (ref 96–112)
Creatinine, Ser: 0.81 mg/dL (ref 0.40–1.50)
GFR: 109.02 mL/min (ref >60.00)
Glucose, Bld: 82 mg/dL (ref 70–99)
Potassium: 3.7 mEq/L (ref 3.5–5.1)
Sodium: 137 mEq/L (ref 135–145)
Total Bilirubin: 1 mg/dL (ref 0.2–1.2)
Total Protein: 7.8 g/dL (ref 6.0–8.3)

## 2020-01-24 LAB — LIPID PANEL
Cholesterol: 109 mg/dL (ref 0–200)
HDL: 19.5 mg/dL — ABNORMAL LOW (ref >39.00)
LDL Cholesterol: 51 mg/dL (ref 0–99)
NonHDL: 89.34
Total CHOL/HDL Ratio: 6
Triglycerides: 191 mg/dL — ABNORMAL HIGH (ref 0.0–149.0)
VLDL: 38.2 mg/dL (ref 0.0–40.0)

## 2020-01-26 ENCOUNTER — Other Ambulatory Visit: Payer: Self-pay

## 2020-01-26 ENCOUNTER — Encounter: Payer: Self-pay | Admitting: Internal Medicine

## 2020-01-26 ENCOUNTER — Telehealth: Payer: Self-pay | Admitting: Internal Medicine

## 2020-01-26 ENCOUNTER — Ambulatory Visit (INDEPENDENT_AMBULATORY_CARE_PROVIDER_SITE_OTHER): Payer: BC Managed Care – PPO | Admitting: Internal Medicine

## 2020-01-26 VITALS — BP 126/82 | HR 88 | Temp 98.6°F | Wt 228.0 lb

## 2020-01-26 DIAGNOSIS — M542 Cervicalgia: Secondary | ICD-10-CM

## 2020-01-26 MED ORDER — CYCLOBENZAPRINE HCL 10 MG PO TABS: 10.0000 mg | ORAL_TABLET | Freq: Every evening | ORAL | 0 refills | Status: DC | PRN

## 2020-01-26 NOTE — Telephone Encounter (Signed)
Patient called to make an appointment, he wanted a Saturday appointment for left side neck pain for the last 2 days. Patient was offer a possible virtual with [facility]. Patient said he could not do a virtual today because he is at work. Patient stated he would go to a Urgent Care tomorrow. Patient was informed if he changed his mind to call office back and office would try to see if there is any available appointments at [facility].

## 2020-01-26 NOTE — Progress Notes (Signed)
Subjective:    Patient ID: Phillip Howell, male    DOB: 08-Sep-1986, 34 y.o.   MRN: [id number]  HPI  Pt presents to the clinic today with c/o left side neck pain. This started this morning. He describes the pain as sharp. The pain radiates into his left shoulder. The pain is worse with movement. He denies headaches, dizziness, left arm pain, numbness, tingling or weakness. He denies any injury that he is aware of. He has tried Ibuprofen and Icy Hot with some relief.  He had an xray cervical spine 07/2015 which was normal.  Review of Systems      Past Medical History:  Diagnosis Date  . Allergy    i.e dust  . Anxiety   . Back pain    upper   . Chicken pox   . Dyshidrotic eczema    L hand   . Hyperthyroidism     No current outpatient medications on file.   No current facility-administered medications for this visit.    No Known Allergies  Family History  Problem Relation Age of Onset  . Diabetes Father   . Mental illness Sister   . Mental retardation Sister        Down Syndrome   . Thyroid disease Neg Hx     Social History   Socioeconomic History  . Marital status: Married    Spouse name: Not on file  . Number of children: Not on file  . Years of education: Not on file  . Highest education level: Not on file  Occupational History  . Not on file  Tobacco Use  . Smoking status: Former Games developer  . Smokeless tobacco: Never Used  . Tobacco comment: smoked 18 to 21 4-5 cig qd   Substance and Sexual Activity  . Alcohol use: No  . Drug use: No  . Sexual activity: Yes    Comment: wife   Other Topics Concern  . Not on file  Social History Narrative   Works at [employer] with [employer] 3rd shift    Former smoker age [age]    From [place].    Married no kids    GED Licensed conveyancer   Social Determinants of Health   Financial Resource Strain:   . Difficulty of Paying Living Expenses: Not on file  Food Insecurity:   . Worried About Programme researcher, broadcasting/film/video in the Last Year: Not on  file  . Ran Out of Food in the Last Year: Not on file  Transportation Needs:   . Lack of Transportation (Medical): Not on file  . Lack of Transportation (Non-Medical): Not on file  Physical Activity:   . Days of Exercise per Week: Not on file  . Minutes of Exercise per Session: Not on file  Stress:   . Feeling of Stress : Not on file  Social Connections:   . Frequency of Communication with Friends and Family: Not on file  . Frequency of Social Gatherings with Friends and Family: Not on file  . Attends Religious Services: Not on file  . Active Member of Clubs or Organizations: Not on file  . Attends Banker Meetings: Not on file  . Marital Status: Not on file  Intimate Partner Violence:   . Fear of Current or Ex-Partner: Not on file  . Emotionally Abused: Not on file  . Physically Abused: Not on file  . Sexually Abused: Not on file     Constitutional: Denies fever, malaise, fatigue, headache or  abrupt weight changes.  Respiratory: Denies difficulty breathing, shortness of breath, cough or sputum production.   Cardiovascular: Denies chest pain, chest tightness, palpitations or swelling in the hands or feet.  Musculoskeletal: Pt reports left side neck pain, decrease in ROM. Denies difficulty with gait, or joint swelling.  Skin: Denies redness, rashes, lesions or ulcercations.  Neurological: Denies dizziness, difficulty with memory, difficulty with speech or problems with balance and coordination.   No other specific complaints in a complete review of systems (except as listed in HPI above).  Objective:   Physical Exam  BP 126/82   Pulse 88   Temp 98.6 F (37 C) (Temporal)   Wt 228 lb (103.4 kg)   SpO2 98%   BMI 31.80 kg/m   Wt Readings from Last 3 Encounters:  10/06/19 222 lb (100.7 kg)  09/01/19 220 lb (99.8 kg)  07/03/19 222 lb 3.2 oz (100.8 kg)    General: Appears stated age, well developed, well nourished in NAD. Skin: Warm, dry and intact. No rashes  noted. Cardiovascular: Normal rate and rhythm.  Pulmonary/Chest: Normal effort and positive vesicular breath sounds. No respiratory distress. No wheezes, rales or ronchi noted.  Musculoskeletal: Decreased extension and rotation to the right of the cervical spine. Normal flexion, rotation to the left and lateral bending. Mild bony tenderness noted over the cervical spine. Pain with palpation of the left side of the neck. Strength 5/5 BUE. Hand grips equal.  Neurological: Alert and oriented. Coordination normal.    BMET    Component Value Date/Time   NA 137 01/24/2020 1420   NA 138 11/24/2014 1127   K 3.7 01/24/2020 1420   K 3.6 11/24/2014 1127   CL 102 01/24/2020 1420   CL 104 11/24/2014 1127   CO2 29 01/24/2020 1420   CO2 25 11/24/2014 1127   GLUCOSE 82 01/24/2020 1420   GLUCOSE 94 11/24/2014 1127   BUN 12 01/24/2020 1420   BUN 15 11/24/2014 1127   CREATININE 0.81 01/24/2020 1420   CREATININE 0.78 11/24/2014 1127   CALCIUM 9.4 01/24/2020 1420   CALCIUM 8.4 (L) 11/24/2014 1127   GFRNONAA >60 09/01/2019 1045   GFRNONAA >60 11/24/2014 1127   GFRNONAA >60 06/26/2014 1328   GFRAA >60 09/01/2019 1045   GFRAA >60 11/24/2014 1127   GFRAA >60 06/26/2014 1328    Lipid Panel     Component Value Date/Time   CHOL 109 01/24/2020 1420   TRIG 191.0 (H) 01/24/2020 1420   HDL 19.50 (L) 01/24/2020 1420   CHOLHDL 6 01/24/2020 1420   VLDL 38.2 01/24/2020 1420   LDLCALC 51 01/24/2020 1420    CBC    Component Value Date/Time   WBC 6.3 09/01/2019 1045   RBC 4.28 09/01/2019 1045   HGB 14.1 09/01/2019 1045   HGB 15.6 11/24/2014 1127   HCT 40.6 09/01/2019 1045   HCT 45.8 11/24/2014 1127   PLT 181 09/01/2019 1045   PLT 164 11/24/2014 1127   MCV 94.9 09/01/2019 1045   MCV 98 11/24/2014 1127   MCH 32.9 09/01/2019 1045   MCHC 34.7 09/01/2019 1045   RDW 11.5 09/01/2019 1045   RDW 12.6 11/24/2014 1127   LYMPHSABS 1.4 12/06/2018 0756   LYMPHSABS 0.5 (L) 11/24/2014 1127   MONOABS 0.4  12/06/2018 0756   MONOABS 0.5 11/24/2014 1127   EOSABS 0.1 12/06/2018 0756   EOSABS 0.1 11/24/2014 1127   BASOSABS 0.0 12/06/2018 0756   BASOSABS 0.1 11/24/2014 1127    Hgb A1C No results found for:  HGBA1C        Assessment & Plan:  Acute Left Sided Neck Pain:  No indication for repeat xray at this time Continue Ibuprofen and Icy Hot Encouraged heat and massage Neck exercises given RX for Flexeril 10 mg QHS prn- sedation caution given  Return precautions discussed  Webb Silversmith, NP This visit occurred during the SARS-CoV-2 public health emergency.  Safety protocols were in place, including screening questions prior to the visit, additional usage of staff PPE, and extensive cleaning of exam room while observing appropriate contact time as indicated for disinfecting solutions.

## 2020-01-26 NOTE — Patient Instructions (Signed)

## 2020-02-07 ENCOUNTER — Encounter: Payer: Self-pay | Admitting: Internal Medicine

## 2020-02-07 ENCOUNTER — Ambulatory Visit (INDEPENDENT_AMBULATORY_CARE_PROVIDER_SITE_OTHER): Payer: BC Managed Care – PPO | Admitting: Internal Medicine

## 2020-02-07 ENCOUNTER — Other Ambulatory Visit: Payer: Self-pay

## 2020-02-07 VITALS — Ht 71.0 in | Wt 228.0 lb

## 2020-02-07 DIAGNOSIS — R5383 Other fatigue: Secondary | ICD-10-CM

## 2020-02-07 DIAGNOSIS — E611 Iron deficiency: Secondary | ICD-10-CM

## 2020-02-07 DIAGNOSIS — E559 Vitamin D deficiency, unspecified: Secondary | ICD-10-CM

## 2020-02-07 DIAGNOSIS — R946 Abnormal results of thyroid function studies: Secondary | ICD-10-CM

## 2020-02-07 DIAGNOSIS — R0683 Snoring: Secondary | ICD-10-CM

## 2020-02-07 DIAGNOSIS — E0590 Thyrotoxicosis, unspecified without thyrotoxic crisis or storm: Secondary | ICD-10-CM | POA: Diagnosis not present

## 2020-02-07 DIAGNOSIS — E291 Testicular hypofunction: Secondary | ICD-10-CM

## 2020-02-07 DIAGNOSIS — F419 Anxiety disorder, unspecified: Secondary | ICD-10-CM

## 2020-02-07 DIAGNOSIS — E538 Deficiency of other specified B group vitamins: Secondary | ICD-10-CM

## 2020-02-07 NOTE — Patient Instructions (Signed)
Sleep Apnea Sleep apnea is a condition in which breathing pauses or becomes shallow during sleep. Episodes of sleep apnea usually last 10 seconds or longer, and they may occur as many as 20 times an hour. Sleep apnea disrupts your sleep and keeps your body from getting the rest that it needs. This condition can increase your risk of certain health problems, including:  Heart attack.  Stroke.  Obesity.  Diabetes.  Heart failure.  Irregular heartbeat. What are the causes? There are three kinds of sleep apnea:  Obstructive sleep apnea. This kind is caused by a blocked or collapsed airway.  Central sleep apnea. This kind happens when the part of the brain that controls breathing does not send the correct signals to the muscles that control breathing.  Mixed sleep apnea. This is a combination of obstructive and central sleep apnea. The most common cause of this condition is a collapsed or blocked airway. An airway can collapse or become blocked if:  Your throat muscles are abnormally relaxed.  Your tongue and tonsils are larger than normal.  You are overweight.  Your airway is smaller than normal. What increases the risk? You are more likely to develop this condition if you:  Are overweight.  Smoke.  Have a smaller than normal airway.  Are elderly.  Are male.  Drink alcohol.  Take sedatives or tranquilizers.  Have a family history of sleep apnea. What are the signs or symptoms? Symptoms of this condition include:  Trouble staying asleep.  Daytime sleepiness and tiredness.  Irritability.  Loud snoring.  Morning headaches.  Trouble concentrating.  Forgetfulness.  Decreased interest in sex.  Unexplained sleepiness.  Mood swings.  Personality changes.  Feelings of depression.  Waking up often during the night to urinate.  Dry mouth.  Sore throat. How is this diagnosed? This condition may be diagnosed with:  A medical history.  A physical  exam.  A series of tests that are done while you are sleeping (sleep study). These tests are usually done in a sleep lab, but they may also be done at home. How is this treated? Treatment for this condition aims to restore normal breathing and to ease symptoms during sleep. It may involve managing health issues that can affect breathing, such as high blood pressure or obesity. Treatment may include:  Sleeping on your side.  Using a decongestant if you have nasal congestion.  Avoiding the use of depressants, including alcohol, sedatives, and narcotics.  Losing weight if you are overweight.  Making changes to your diet.  Quitting smoking.  Using a device to open your airway while you sleep, such as: ? An oral appliance. This is a custom-made mouthpiece that shifts your lower jaw forward. ? A continuous positive airway pressure (CPAP) device. This device blows air through a mask when you breathe out (exhale). ? A nasal expiratory positive airway pressure (EPAP) device. This device has valves that you put into each nostril. ? A bi-level positive airway pressure (BPAP) device. This device blows air through a mask when you breathe in (inhale) and breathe out (exhale).  Having surgery if other treatments do not work. During surgery, excess tissue is removed to create a wider airway. It is important to get treatment for sleep apnea. Without treatment, this condition can lead to:  High blood pressure.  Coronary artery disease.  In men, an inability to achieve or maintain an erection (impotence).  Reduced thinking abilities. Follow these instructions at home: Lifestyle  Make any lifestyle changes   that your health care provider recommends.  Eat a healthy, well-balanced diet.  Take steps to lose weight if you are overweight.  Avoid using depressants, including alcohol, sedatives, and narcotics.  Do not use any products that contain nicotine or tobacco, such as cigarettes,  e-cigarettes, and chewing tobacco. If you need help quitting, ask your health care provider. General instructions  Take over-the-counter and prescription medicines only as told by your health care provider.  If you were given a device to open your airway while you sleep, use it only as told by your health care provider.  If you are having surgery, make sure to tell your health care provider you have sleep apnea. You may need to bring your device with you.  Keep all follow-up visits as told by your health care provider. This is important. Contact a health care provider if:  The device that you received to open your airway during sleep is uncomfortable or does not seem to be working.  Your symptoms do not improve.  Your symptoms get worse. Get help right away if:  You develop: ? Chest pain. ? Shortness of breath. ? Discomfort in your back, arms, or stomach.  You have: ? Trouble speaking. ? Weakness on one side of your body. ? Drooping in your face. These symptoms may represent a serious problem that is an emergency. Do not wait to see if the symptoms will go away. Get medical help right away. Call your local emergency services (911 in the [place] Do not drive yourself to the hospital. Summary  Sleep apnea is a condition in which breathing pauses or becomes shallow during sleep.  The most common cause is a collapsed or blocked airway.  The goal of treatment is to restore normal breathing and to ease symptoms during sleep. This information is not intended to replace advice given to you by your health care provider. Make sure you discuss any questions you have with your health care provider. Document Revised: 05/17/2019 Document Reviewed: 07/26/2018 Elsevier Patient Education  2020 Elsevier Inc.  

## 2020-02-07 NOTE — Progress Notes (Signed)
Telephone Note  I connected with Phillip Howell  on 02/07/20 at  2:15 PM EST by telephone and verified that I am speaking with the correct person using two identifiers.  Location patient: home Location provider:work or home office Persons participating in the virtual visit: patient, provider  I discussed the limitations of evaluation and management by telemedicine and the availability of in person appointments. The patient expressed understanding and agreed to proceed.   HPI: 1. Fatigue with h/o abnormal tfts will repeat thyroid labs cmet 01/24/20 normal will check thyroid labs, cbc, test, vitamin D and B12 to further w/u he also reports he eats out a lot and not a well balanced diet and no veggies  2. C/o snoring and tossing and turning in the night. Denies dreaming at night. He does get 7 hours of sleep he thinks but works long shifts 12 hours a day 4-5 days per week and no exercise  His wife is concerned and this is the main reason for appt today  3. C/o lack of motivation with working long hours concentration ok. He is more moody and anxious in the am. Tried lexapro 10 in the past but didn't notice much difference.     ROS: See pertinent positives and negatives per HPI.  Past Medical History:  Diagnosis Date  . Allergy    i.e dust  . Anxiety   . Back pain    upper   . Chicken pox   . Dyshidrotic eczema    L hand   . Hyperthyroidism     Past Surgical History:  Procedure Laterality Date  . NO PAST SURGERIES      Family History  Problem Relation Age of Onset  . Diabetes Father   . Mental illness Sister   . Mental retardation Sister        Down Syndrome   . Thyroid disease Neg Hx     SOCIAL HX:  Works at [employer] with [employer] 3rd shift  Former smoker age [age]  From [place].  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  cyclobenzaprine (FLEXERIL) 10 MG tablet, Take 1 tablet (10 mg total) by mouth at bedtime as needed for muscle spasms., Disp:  15 tablet, Rfl: 0 .  Multiple Vitamin (MULTIVITAMIN) tablet, Take 1 tablet by mouth daily., Disp: , Rfl:   EXAM:  VITALS per patient if applicable:  GENERAL: alert, oriented, appears well and in no acute distress  PSYCH/NEURO: pleasant and cooperative, no obvious depression or anxiety, speech and thought processing grossly intact  ASSESSMENT AND PLAN:  Discussed the following assessment and plan:  Fatigue, unspecified type - Plan: TSH, T4, free, CBC with Differential/Platelet, iron panel Testosterone, B12, vitamin D Referral for sleep study r/o sleep d/o, OSA rec exercise and eating more well balanced diet with veggies  Will take mvt for men otc   Hyperthyroidism - Plan: TSH, T4, free, T3, free Abnormal thyroid function test - Plan: TSH, T4, free, T3, free Est with endocrine   Anxiety worse in the am  Will address sleep study and if not improved consider meds  Was on lexapro 10 did not help anxiety  Will need to try another SSRI vs SNRI vs other in the future disc at f/u   -we discussed possible serious and likely etiologies, options for evaluation and workup, limitations of telemedicine visit vs in person visit, treatment, treatment risks and precautions. Pt prefers to treat via telemedicine empirically rather then risking or undertaking  an in person visit at this moment. Patient agrees to seek prompt in person care if worsening, new symptoms arise, or if is not improving with treatment.   I discussed the assessment and treatment plan with the patient. The patient was provided an opportunity to ask questions and all were answered. The patient agreed with the plan and demonstrated an understanding of the instructions.   The patient was advised to call back or seek an in-person evaluation if the symptoms worsen or if the condition fails to improve as anticipated.  Time spent 20 minutes  Bevelyn Buckles, MD

## 2020-02-08 ENCOUNTER — Telehealth: Payer: Self-pay

## 2020-02-08 NOTE — Telephone Encounter (Signed)
Patient scheduled for 02/12/20.

## 2020-02-08 NOTE — Telephone Encounter (Signed)
-----   Message from Bevelyn Buckles, MD sent at 02/07/2020  2:46 PM EST ----- Sch nonfasting labs asap before 10 am

## 2020-02-12 ENCOUNTER — Other Ambulatory Visit: Payer: Self-pay

## 2020-02-12 ENCOUNTER — Other Ambulatory Visit (INDEPENDENT_AMBULATORY_CARE_PROVIDER_SITE_OTHER): Payer: BC Managed Care – PPO

## 2020-02-12 DIAGNOSIS — R5383 Other fatigue: Secondary | ICD-10-CM | POA: Diagnosis not present

## 2020-02-12 DIAGNOSIS — E611 Iron deficiency: Secondary | ICD-10-CM | POA: Diagnosis not present

## 2020-02-12 DIAGNOSIS — E538 Deficiency of other specified B group vitamins: Secondary | ICD-10-CM | POA: Diagnosis not present

## 2020-02-12 DIAGNOSIS — E559 Vitamin D deficiency, unspecified: Secondary | ICD-10-CM

## 2020-02-12 DIAGNOSIS — E0590 Thyrotoxicosis, unspecified without thyrotoxic crisis or storm: Secondary | ICD-10-CM | POA: Diagnosis not present

## 2020-02-12 DIAGNOSIS — E291 Testicular hypofunction: Secondary | ICD-10-CM

## 2020-02-12 DIAGNOSIS — R946 Abnormal results of thyroid function studies: Secondary | ICD-10-CM | POA: Diagnosis not present

## 2020-02-12 LAB — CBC WITH DIFFERENTIAL/PLATELET
Basophils Absolute: 0.1 10*3/uL (ref 0.0–0.1)
Basophils Relative: 1.1 % (ref 0.0–3.0)
Eosinophils Absolute: 0.2 10*3/uL (ref 0.0–0.7)
Eosinophils Relative: 3.2 % (ref 0.0–5.0)
HCT: 39.6 % (ref 39.0–52.0)
Hemoglobin: 14 g/dL (ref 13.0–17.0)
Lymphocytes Relative: 37.7 % (ref 12.0–46.0)
Lymphs Abs: 1.9 10*3/uL (ref 0.7–4.0)
MCHC: 35.3 g/dL (ref 30.0–36.0)
MCV: 96.3 fl (ref 78.0–100.0)
Monocytes Absolute: 0.5 10*3/uL (ref 0.1–1.0)
Monocytes Relative: 8.9 % (ref 3.0–12.0)
Neutro Abs: 2.5 10*3/uL (ref 1.4–7.7)
Neutrophils Relative %: 49.1 % (ref 43.0–77.0)
Platelets: 188 10*3/uL (ref 150.0–400.0)
RBC: 4.11 Mil/uL — ABNORMAL LOW (ref 4.22–5.81)
RDW: 12.7 % (ref 11.5–15.5)
WBC: 5.1 10*3/uL (ref 4.0–10.5)

## 2020-02-12 LAB — T3, FREE: T3, Free: 3.7 pg/mL (ref 2.3–4.2)

## 2020-02-12 LAB — IRON,TIBC AND FERRITIN PANEL
%SAT: 44 % (calc) (ref 20–48)
Ferritin: 102 ng/mL (ref 38–380)
Iron: 119 ug/dL (ref 50–180)
TIBC: 268 mcg/dL (calc) (ref 250–425)

## 2020-02-12 LAB — TESTOSTERONE: Testosterone: 247.13 ng/dL — ABNORMAL LOW (ref 300.00–890.00)

## 2020-02-12 LAB — T4, FREE: Free T4: 0.76 ng/dL (ref 0.60–1.60)

## 2020-02-12 LAB — VITAMIN B12: Vitamin B-12: 837 pg/mL (ref 211–911)

## 2020-02-12 LAB — TSH: TSH: 0.49 u[IU]/mL (ref 0.35–4.50)

## 2020-02-12 LAB — VITAMIN D 25 HYDROXY (VIT D DEFICIENCY, FRACTURES): VITD: 25.32 ng/mL — ABNORMAL LOW (ref 30.00–100.00)

## 2020-02-12 NOTE — Telephone Encounter (Signed)
AVS printed and mailed to patient for last televisit.

## 2020-02-14 ENCOUNTER — Telehealth: Payer: Self-pay

## 2020-02-14 NOTE — Telephone Encounter (Signed)
Lab results received by PCP and reviewed by Dr. Everardo All. Per Dr. Everardo All, appt is required to further discuss. LVM requesting returned call.

## 2020-02-14 NOTE — Telephone Encounter (Signed)
-----   Message from Romero Belling, MD sent at 02/14/2020 11:32 AM EST ----- please contact patient: F/u is due ----- Message ----- From: McLean-Scocuzza, Pasty Spillers, MD Sent: 02/14/2020  11:04 AM EST To: Romero Belling, MD, Tilford Pillar, CMA  Iron #s low normal would recommend otc multivitamin with iron  Thyroid labs normal but TSH borderline low  Vitamin D low rec D3 4000 IU daily otc and multivitamin  Testosterone is low  -please f/u with Dr. Everardo All  -can you get him scheduled for f/u Dr. Everardo All?   This can make you have low energy and be moody   Blood cts normal

## 2020-02-16 NOTE — Telephone Encounter (Signed)
lmtcb to set up appt 

## 2020-02-21 ENCOUNTER — Other Ambulatory Visit: Payer: Self-pay

## 2020-02-21 ENCOUNTER — Ambulatory Visit: Payer: BC Managed Care – PPO | Admitting: Endocrinology

## 2020-02-22 ENCOUNTER — Encounter: Payer: Self-pay | Admitting: Endocrinology

## 2020-02-22 ENCOUNTER — Ambulatory Visit (INDEPENDENT_AMBULATORY_CARE_PROVIDER_SITE_OTHER): Payer: BC Managed Care – PPO | Admitting: Endocrinology

## 2020-02-22 VITALS — BP 120/80 | HR 75 | Ht 71.0 in | Wt 225.4 lb

## 2020-02-22 DIAGNOSIS — E0590 Thyrotoxicosis, unspecified without thyrotoxic crisis or storm: Secondary | ICD-10-CM | POA: Diagnosis not present

## 2020-02-22 LAB — TSH: TSH: 0.5 u[IU]/mL (ref 0.35–4.50)

## 2020-02-22 LAB — T4, FREE: Free T4: 0.87 ng/dL (ref 0.60–1.60)

## 2020-02-22 NOTE — Progress Notes (Signed)
Subjective:    Patient ID: Phillip Howell, male    DOB: [birth year], 34 y.o.   MRN: [id number]  HPI Pt returns for f/u of hyperthyroidism (dx'ed late 2019 (TSH was normal in 2018); US showed thyroid is upper normal in size and hypervascular without focal discrete nodule; he declines RAI and surgery; in 7/20, tapazole was stopped, for elev TSH).  pt states he feels well in general.  Specifically, he denies palpitations and tremor Past Medical History:  Diagnosis Date  . Allergy    i.e dust  . Anxiety   . Back pain    upper   . Chicken pox   . Dyshidrotic eczema    L hand   . Hyperthyroidism     Past Surgical History:  Procedure Laterality Date  . NO PAST SURGERIES      Social History   Socioeconomic History  . Marital status: Married    Spouse name: Not on file  . Number of children: Not on file  . Years of education: Not on file  . Highest education level: Not on file  Occupational History  . Not on file  Tobacco Use  . Smoking status: Former Games developer  . Smokeless tobacco: Never Used  . Tobacco comment: smoked 18 to 21 4-5 cig qd   Substance and Sexual Activity  . Alcohol use: No  . Drug use: No  . Sexual activity: Yes    Comment: wife   Other Topics Concern  . Not on file  Social History Narrative   Works at [employer] with [employer] 3rd shift    Former smoker age [age]    From [place] [employer].    Married no kids    GED Licensed conveyancer   Social Determinants of Health   Financial Resource Strain:   . Difficulty of Paying Living Expenses:   Food Insecurity:   . Worried About Programme researcher, broadcasting/film/video in the Last Year:   . Barista in the Last Year:   Transportation Needs:   . Freight forwarder (Medical):   Marland Kitchen Lack of Transportation (Non-Medical):   Physical Activity:   . Days of Exercise per Week:   . Minutes of Exercise per Session:   Stress:   . Feeling of Stress :   Social Connections:   . Frequency of Communication with Friends and Family:   . Frequency of Social  Gatherings with Friends and Family:   . Attends Religious Services:   . Active Member of Clubs or Organizations:   . Attends Banker Meetings:   Marland Kitchen Marital Status:   Intimate Partner Violence:   . Fear of Current or Ex-Partner:   . Emotionally Abused:   Marland Kitchen Physically Abused:   . Sexually Abused:     Current Outpatient Medications on File Prior to Visit  Medication Sig Dispense Refill  . Multiple Vitamin (MULTIVITAMIN) tablet Take 1 tablet by mouth daily.     No current facility-administered medications on file prior to visit.    No Known Allergies  Family History  Problem Relation Age of Onset  . Diabetes Father   . Mental illness Sister   . Mental retardation Sister        Down Syndrome   . Thyroid disease Neg Hx     BP 120/80 (BP Location: Left Arm, Patient Position: Sitting, Cuff Size: Normal)   Pulse 75   Ht 5\' 11"  (1.803 m)   Wt 225 lb 6.4 oz (102.2 kg)  SpO2 100%   BMI 31.44 kg/m    Review of Systems     Objective:   Physical Exam VITAL SIGNS:  See vs page GENERAL: no distress NECK: There is no palpable thyroid enlargement.  No thyroid nodule is palpable.  No palpable lymphadenopathy at the anterior neck.   Lab Results  Component Value Date   TSH 0.50 02/22/2020   [place]: borderline diffuse goiter.     Assessment & Plan:  Hyperthyroidism: stable off rx.   Patient Instructions  Blood tests are requested for you today.  We'll let you know about the results.  Please come back for a follow-up appointment in 4 months.   The overactivity will probably come back with time (underactive is also possible, but less likely).

## 2020-02-22 NOTE — Patient Instructions (Signed)
Blood tests are requested for you today.  We'll let you know about the results.  Please come back for a follow-up appointment in 4 months.   The overactivity will probably come back with time (underactive is also possible, but less likely).

## 2020-02-23 ENCOUNTER — Telehealth: Payer: Self-pay

## 2020-02-23 NOTE — Telephone Encounter (Signed)
LAB RESULTS  Lab results were reviewed by Dr. Ellison. A letter has been mailed to pt home address. For future reference, letter can be found in Epic. 

## 2020-02-23 NOTE — Telephone Encounter (Signed)
-----   Message from Romero Belling, MD sent at 02/22/2020  6:05 PM EST ----- please contact patient: Normal--good.  I'll see you next time.

## 2020-02-28 ENCOUNTER — Ambulatory Visit (INDEPENDENT_AMBULATORY_CARE_PROVIDER_SITE_OTHER): Payer: BC Managed Care – PPO | Admitting: Acute Care

## 2020-02-28 ENCOUNTER — Other Ambulatory Visit: Payer: Self-pay

## 2020-02-28 ENCOUNTER — Encounter: Payer: Self-pay | Admitting: Acute Care

## 2020-02-28 VITALS — BP 124/74 | HR 67 | Temp 97.7°F | Ht 71.0 in | Wt 228.0 lb

## 2020-02-28 DIAGNOSIS — R5383 Other fatigue: Secondary | ICD-10-CM | POA: Diagnosis not present

## 2020-02-28 NOTE — Patient Instructions (Signed)
It is nice to meet you today. We will order an in lab sleep study. We will call you when the results are read. We will schedule you with Dr. Belia Heman or NP  once the sleep study has been read  to review results with you. If you do have sleep apnea we will go over therapy options Remember to establish a good bedtime routine, and work on sleep hygiene.  Limit daytime naps , avoid stimulants such as caffeine and nicotine close to bedtime, exercise daily to promote sleep quality, avoid heavy , spicy, fried , or rich foods before bed. Ensure adequate exposure to natural light during the day,establish a relaxing bedtime routine with a pleasant sleep environment ( Bedroom between 60 and 67 degrees, turn off bright lights , TV or device screens screens , consider black out curtains or white noise machines) Do not drive if sleepy. Follow up once the study has been completed and read. Please contact office for sooner follow up if symptoms do not improve or worsen or seek emergency care

## 2020-02-28 NOTE — Progress Notes (Signed)
History of Present Illness Phillip Howell is a 34 y.o. male who presents for sleep consult for sleep apnea. He will be followed by Dr. Belia Howell.   02/28/2020  Pt. Presents for sleep consult.He has been referred by his PCP. Per the patient his wife has noted that he snores all night long. He has daytime sleepiness and fatigue. He does not have difficulty falling asleep, but he does have a hard time staying asleep. He goes to bed at 10 pm. He gets up at 6 am. He is asleep within 30 minutes.He states that he tosses and turns and awakens feeling dehydrated . He does not have morning headache, but he is very tired upon awakening. He does not know of any family members with sleep apnea. Pt. States he has gained 15-20 pounds in the last 2 years . He feels this is because he exercises less and has no energy to exercise.He does have anxiety. He was on Lexapro, but this was d/c'd by his PCP as she was concerned it was contributing to his sleepiness.Pt. smoked in the past ( age [age]). He quit in 2008. 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 He does drink alcohol once  each weekend. He is a Surveyor, minerals. He denies any other respiratory complaints,  Test Results:  CBC Latest Ref Rng & Units 02/12/2020 09/01/2019 12/06/2018  WBC 4.0 - 10.5 K/uL 5.1 6.3 4.3  Hemoglobin 13.0 - 17.0 g/dL 65.9 93.5 70.1  Hematocrit 39.0 - 52.0 % 39.6 40.6 44.4  Platelets 150.0 - 400.0 K/uL 188.0 181 187.0    BMP Latest Ref Rng & Units 01/24/2020 09/01/2019 12/06/2018  Glucose 70 - 99 mg/dL 82 86 93  BUN 6 - 23 mg/dL 12 12 13   Creatinine 0.40 - 1.50 mg/dL 7.79 3.90  Sodium 135 - 145 mEq/L 137 139 139  Potassium 3.5 - 5.1 mEq/L 3.7 4.0 4.4  Chloride 96 - 112 mEq/L 102 105 106  CO2 19 - 32 mEq/L 29 27 26   Calcium 8.4 - 10.5 mg/dL 9.4 9.1 9.8    BNP No results found for: BNP  ProBNP No results found for: PROBNP  PFT No results found for: FEV1PRE, FEV1POST, FVCPRE, FVCPOST, TLC, DLCOUNC,  PREFEV1FVCRT, PSTFEV1FVCRT  No results found.   Past medical hx Past Medical History:  Diagnosis Date  . Allergy    i.e dust  . Anxiety   . Back pain    upper   . Chicken pox   . Dyshidrotic eczema    L hand   . Hyperthyroidism      Social History   Tobacco Use  . Smoking status: Former Smoker    Packs/day: 0.50    Years: 2.00    Pack years: 1.00    Types: Cigarettes    Quit date: 12/14/2006    Years since quitting: 13.2  . Smokeless tobacco: Never Used  Substance Use Topics  . Alcohol use: No  . Drug use: No    Mr.Phillip Howell reports that he quit smoking about 13 years ago. His smoking use included cigarettes. He has a 1.00 pack-year smoking history. He has never used smokeless tobacco. He reports that he does not drink alcohol or use drugs.  Tobacco Cessation: Former smoker , Quit for 3 years. Quit 2008  Past surgical hx, Family hx, Social hx all reviewed.  Current Outpatient Medications on File Prior to Visit  Medication Sig  . Multiple Vitamin (MULTIVITAMIN) tablet Take 1 tablet  by mouth daily.   No current facility-administered medications on file prior to visit.     No Known Allergies  Review Of Systems:  Constitutional:   No  weight loss, night sweats,  Fevers, chills, fatigue, or  lassitude.  HEENT:   No headaches,  Difficulty swallowing,  Tooth/dental problems, or  Sore throat,                No sneezing, itching, ear ache, nasal congestion, post nasal drip,   CV:  No chest pain,  Orthopnea, PND, swelling in lower extremities, anasarca, dizziness, palpitations, syncope.   GI  No heartburn, indigestion, abdominal pain, nausea, vomiting, diarrhea, change in bowel habits, loss of appetite, bloody stools.   Resp: No shortness of breath with exertion or at rest.  No excess mucus, no productive cough,  No non-productive cough,  No coughing up of blood.  No change in color of mucus.  No wheezing.  No chest wall deformity  Skin: no rash or lesions.  GU: no  dysuria, change in color of urine, no urgency or frequency.  No flank pain, no hematuria   MS:  No joint pain or swelling.  No decreased range of motion.  No back pain.  Psych:  No change in mood or affect. No depression or anxiety.  No memory loss.   Vital Signs BP 124/74 (BP Location: Left Arm, Cuff Size: Normal)   Pulse 67   Temp 97.7 F (36.5 C) (Temporal)   Ht 5\' 11"  (1.803 m)   Wt 228 lb (103.4 kg)   SpO2 98% Comment: on RA  BMI 31.80 kg/m    Physical Exam:  General- No distress,  A&Ox3, pleasant ENT: No sinus tenderness, TM clear, pale nasal mucosa, no oral exudate,no post nasal drip, no LAN Cardiac: S1, S2, regular rate and rhythm, no murmur Chest: No wheeze/ rales/ dullness; no accessory muscle use, no nasal flaring, no sternal retractions Abd.: Soft Non-tender, ND, BS + Ext: No clubbing cyanosis, edema Neuro:  normal strength, MAE x 4, A&O x 3 Skin: No rashes, No lesions, Tattoos, warm and dry Psych: Flat affect   Assessment/Plan  Suspected OSA Snoring and daytime sleepiness Sleeps on his stomach Plan We will schedule an in lab sleep test as there may be a central component to his apnea  We will call you when the results are read. We will schedule you with Dr. Mortimer Howell or NP  once the sleep study has been read  to review results with you. If you do have sleep apnea we will go over therapy options Remember to establish a good bedtime routine, and work on sleep hygiene.  Limit daytime naps , avoid stimulants such as caffeine and nicotine close to bedtime, exercise daily to promote sleep quality, avoid heavy , spicy, fried , or rich foods before bed. Ensure adequate exposure to natural light during the day,establish a relaxing bedtime routine with a pleasant sleep environment ( Bedroom between 60 and 67 degrees, turn off bright lights , TV or device screens screens , consider black out curtains or white noise machines) Do not drive if sleepy. Follow up once the study  has been completed and read. Please contact office for sooner follow up if symptoms do not improve or worsen or seek emergency care  This appointment was 30 min long with over 50% of the time in direct face-to-face patient care, assessment, plan of care, and follow-up.    Magdalen Spatz, NP 02/28/2020  3:51 PM

## 2020-03-18 ENCOUNTER — Telehealth: Payer: Self-pay | Admitting: Internal Medicine

## 2020-03-18 NOTE — Telephone Encounter (Signed)
Pt called he needs a referral for an at Home Sleep Study for insurance to cover it

## 2020-03-19 ENCOUNTER — Telehealth: Payer: Self-pay | Admitting: Acute Care

## 2020-03-19 NOTE — Telephone Encounter (Signed)
Per Elon Jester at [facility], [employer] [place] denied in lab sleep study. A peer to peer reconsideration can be done by calling [employer] at  [phone] and advise that you are calling to do a peer to peer reconsideration. ID # [id number].    Please advise if you are willing to do the peer to peer reconsideration or if you want the patient to have a HST instead.   Thank you Ollen Gross

## 2020-03-19 NOTE — Telephone Encounter (Signed)
I will try to do Thursday when I am in the office. He has central sleep apneas and you cannot do home sleep studies for central apneas. Thanks

## 2020-03-19 NOTE — Telephone Encounter (Signed)
Can you set up for patient?    TMS

## 2020-03-19 NOTE — Telephone Encounter (Signed)
Please advise 

## 2020-03-19 NOTE — Telephone Encounter (Signed)
I have asked  My nurse here in [place] to schedule. This shoulld have been done when I saw the patient in [place]. Hopefully will get done this time.   Thanks

## 2020-03-22 ENCOUNTER — Other Ambulatory Visit: Payer: Self-pay | Admitting: Acute Care

## 2020-03-26 ENCOUNTER — Other Ambulatory Visit: Payer: Self-pay

## 2020-03-26 ENCOUNTER — Telehealth: Payer: Self-pay | Admitting: Acute Care

## 2020-03-26 DIAGNOSIS — G4733 Obstructive sleep apnea (adult) (pediatric): Secondary | ICD-10-CM

## 2020-03-26 NOTE — Telephone Encounter (Signed)
Received letter from [facility] on behalf of [employer] that patients HST has been approved.  Nothing further needed at this time.

## 2020-03-28 NOTE — Telephone Encounter (Signed)
Thanks so much. 

## 2020-04-11 ENCOUNTER — Telehealth: Payer: Self-pay | Admitting: Internal Medicine

## 2020-04-11 ENCOUNTER — Ambulatory Visit: Payer: BC Managed Care – PPO | Admitting: Internal Medicine

## 2020-04-11 NOTE — Telephone Encounter (Signed)
Patient no-showed today's appointment; appointment was for 04/11/20 at 9:00 am, provider notified for review of record. Letter mailed for patient to call and reschedule.

## 2020-04-19 ENCOUNTER — Other Ambulatory Visit: Payer: Self-pay

## 2020-04-19 DIAGNOSIS — G4710 Hypersomnia, unspecified: Secondary | ICD-10-CM | POA: Diagnosis not present

## 2020-04-19 DIAGNOSIS — G4733 Obstructive sleep apnea (adult) (pediatric): Secondary | ICD-10-CM

## 2020-05-03 DIAGNOSIS — G4710 Hypersomnia, unspecified: Secondary | ICD-10-CM | POA: Diagnosis not present

## 2020-05-07 ENCOUNTER — Telehealth: Payer: Self-pay

## 2020-05-07 DIAGNOSIS — G4733 Obstructive sleep apnea (adult) (pediatric): Secondary | ICD-10-CM

## 2020-05-07 DIAGNOSIS — R5383 Other fatigue: Secondary | ICD-10-CM

## 2020-05-07 NOTE — Telephone Encounter (Signed)
Once the order has been placed, we will schedule the in lab study.

## 2020-05-07 NOTE — Telephone Encounter (Addendum)
-   PCC's can we schedule a in lab sleep study.      Message from Bevelyn Ngo, NP sent at 05/03/2020  3:26 PM EDT ----- I had actually requested an in lab study and his insurance refused, insisted on a Home Sleep Study.  Delaney Meigs, please place an order for  in lab NSPG . We will try again. Thanks ----- Message ----- From: Coralyn Helling, MD Sent: 05/03/2020   2:47 PM EDT To: Bevelyn Ngo, NP  Sarah,  Home sleep study from 04/19/20 >> AHI 3.2, SpO2 low 88%.  So no significant sleep apnea.  Options are: - weight loss - positional therapy - oral exercises - oral appliance for mandibular advancement, but he would have to pay out of pocket for this  Or   Arrange for in lab NSPG to further assess since home sleep studies can underestimate severity of obstructive sleep apnea.  Thanks.  [place]ine[place]

## 2020-05-07 NOTE — Telephone Encounter (Signed)
Order has been placed for Phillip Howell.

## 2020-06-03 ENCOUNTER — Encounter: Payer: Self-pay | Admitting: Acute Care

## 2020-06-03 ENCOUNTER — Ambulatory Visit (INDEPENDENT_AMBULATORY_CARE_PROVIDER_SITE_OTHER): Payer: BC Managed Care – PPO | Admitting: Acute Care

## 2020-06-03 ENCOUNTER — Other Ambulatory Visit: Payer: Self-pay

## 2020-06-03 VITALS — BP 122/80 | HR 82 | Temp 98.0°F | Ht 71.0 in | Wt 220.8 lb

## 2020-06-03 DIAGNOSIS — R5383 Other fatigue: Secondary | ICD-10-CM | POA: Diagnosis not present

## 2020-06-03 NOTE — Progress Notes (Signed)
History of Present Illness Phillip Howell is a 34 y.o. male former smoker ( Quit 2008)  with snoring and daytime sleepiness, and  suspected sleep apnea. He will be followed by Phillip Howell.    06/03/2020  Pt. Presents to discuss results of his sleep study. Pt. Had home sleep study done 04/16/2020. It was read as no significant sleep apnea, but he had desaturations to 88% during the study. He states he does sleep on his side the majority of the time, but he does occasionally wake up on his back. He continues to endorse daytime sleepiness, and difficulty staying awake. We discussed the fact that that the desats to 88% are unusual in a 34 year old, and that home sleep studies can under-estimate the severity of sleep apnea. We will place an order for an in lab NSPG to better evaluate the reasons for desaturation. He is in agreement with this plan.   Test Results: Home Sleep Study Home sleep study from 04/19/20 >> AHI 3.2, SpO2 low 88%.  So no significant sleep apnea.  Options are: - weight loss - positional therapy - oral exercises - oral appliance for mandibular advancement, but he would have to pay out of pocket for this  Or   Arrange for in lab NSPG to further assess since home sleep studies can underestimate severity of obstructive sleep apnea.   CBC Latest Ref Rng & Units 02/12/2020 09/01/2019 12/06/2018  WBC 4.0 - 10.5 K/uL 5.1 6.3 4.3  Hemoglobin 13.0 - 17.0 g/dL 69.6 78.9 38.1  Hematocrit 39 - 52 % 39.6 40.6 44.4  Platelets 150 - 400 K/uL 188.0 181 187.0    BMP Latest Ref Rng & Units 01/24/2020 09/01/2019 12/06/2018  Glucose 70 - 99 mg/dL 82 86 93  BUN 6 - 23 mg/dL 12 12 13   Creatinine 0.40 - 1.50 mg/dL 0.17 5.10  Sodium 135 - 145 mEq/L 137 139 139  Potassium 3.5 - 5.1 mEq/L 3.7 4.0 4.4  Chloride 96 - 112 mEq/L 102 105 106  CO2 19 - 32 mEq/L 29 27 26   Calcium 8.4 - 10.5 mg/dL 9.4 9.1 9.8    BNP No results found for: BNP  ProBNP No results found for: PROBNP  PFT No  results found for: FEV1PRE, FEV1POST, FVCPRE, FVCPOST, TLC, DLCOUNC, PREFEV1FVCRT, PSTFEV1FVCRT  No results found.   Past medical hx Past Medical History:  Diagnosis Date  . Allergy    i.e dust  . Anxiety   . Back pain    upper   . Chicken pox   . Dyshidrotic eczema    L hand   . Hyperthyroidism      Social History   Tobacco Use  . Smoking status: Former Smoker    Packs/day: 0.50    Years: 2.00    Pack years: 1.00    Types: Cigarettes    Quit date: 12/14/2006    Years since quitting: 13.4  . Smokeless tobacco: Never Used  Vaping Use  . Vaping Use: Never used  Substance Use Topics  . Alcohol use: No  . Drug use: No    Phillip Howell reports that he quit smoking about 13 years ago. His smoking use included cigarettes. He has a 1.00 pack-year smoking history. He has never used smokeless tobacco. He reports that he does not drink alcohol and does not use drugs.  Tobacco Cessation: Counseling given: Not Answered   Past surgical hx, Family hx, Social hx all reviewed.  Current Outpatient Medications on File Prior to  Visit  Medication Sig  . Multiple Vitamin (MULTIVITAMIN) tablet Take 1 tablet by mouth daily.   No current facility-administered medications on file prior to visit.     No Known Allergies  Review Of Systems:  Constitutional:   No  weight loss, night sweats,  Fevers, chills, fatigue, or  lassitude.  HEENT:   No headaches,  Difficulty swallowing,  Tooth/dental problems, or  Sore throat,                No sneezing, itching, ear ache, nasal congestion, post nasal drip,   CV:  No chest pain,  Orthopnea, PND, swelling in lower extremities, anasarca, dizziness, palpitations, syncope.   GI  No heartburn, indigestion, abdominal pain, nausea, vomiting, diarrhea, change in bowel habits, loss of appetite, bloody stools.   Resp: No shortness of breath with exertion or at rest.  No excess mucus, no productive cough,  No non-productive cough,  No coughing up of blood.   No change in color of mucus.  No wheezing.  No chest wall deformity  Skin: no rash or lesions.  GU: no dysuria, change in color of urine, no urgency or frequency.  No flank pain, no hematuria   MS:  No joint pain or swelling.  No decreased range of motion.  No back pain.  Psych:  No change in mood or affect. No depression or anxiety.  No memory loss.   Vital Signs BP 122/80 (BP Location: Left Arm, Cuff Size: Normal)   Pulse 82   Temp 98 F (36.7 C) (Temporal)   Ht 5\' 11"  (1.803 m)   Wt 220 lb 12.8 oz (100.2 kg)   SpO2 97%   BMI 30.80 kg/m    Physical Exam:  General- No distress,  A&Ox3, pleasant ENT: No sinus tenderness, TM clear, pale nasal mucosa, no oral exudate,no post nasal drip, no LAN Cardiac: S1, S2, regular rate and rhythm, no murmur Chest: No wheeze/ rales/ dullness; no accessory muscle use, no nasal flaring, no sternal retractions Abd.: Soft Non-tender, ND, BS +, Body mass index is 30.8 kg/m. Ext: No clubbing cyanosis, edema Neuro:  normal strength, MAE x 4, A&O x 3 Skin: No rashes, No lesions,  warm and dry Psych: normal mood and behavior   Assessment/Plan  Suspected OSA Home sleep study >> AHI 3.2, SpO2 low 88%.>> No significant sleep apnea, Nocturnal desaturations Plan Your Home Sleep Study did not show significant obstructive sleep apnea. However you did drop your oxygen saturations  to 88%. Because you have significant  desaturations it is possible that your home sleep study under estimated the severity of your sleep apnea.  We will place an order for an in lab split night study. If your insurance will not pay for this, we will have to order night time oxygen Follow up tele visit in  4 weeks with Phillip Howell Please contact office for sooner follow up if symptoms do not improve or worsen or seek emergency care    This appointment was 30 min long with over 50% of the time in direct face-to-face patient care, assessment, plan of care, and  follow-up.    Phillip Spatz, Howell 06/03/2020  12:05 PM

## 2020-06-03 NOTE — Patient Instructions (Addendum)
It is good to see you today. Your Home Sleep Study did not show significant obstructive sleep apnea. However you did drop your oxygen saturations  to 88%. Because you have desaturations it is possible that your home sleep study under estimated the severity of your sleep apnea.  We will place an order for an in lab split night study. If your insurance will not pay for this, we will have to order night time oxygen Follow up tele visit in  4 weeks with Maralyn Sago NP Please contact office for sooner follow up if symptoms do not improve or worsen or seek emergency care

## 2020-06-25 ENCOUNTER — Ambulatory Visit: Payer: BC Managed Care – PPO | Admitting: Endocrinology

## 2020-06-25 DIAGNOSIS — Z0289 Encounter for other administrative examinations: Secondary | ICD-10-CM

## 2020-07-03 ENCOUNTER — Other Ambulatory Visit: Payer: Self-pay

## 2020-07-03 ENCOUNTER — Ambulatory Visit: Payer: BC Managed Care – PPO | Admitting: Acute Care

## 2020-07-03 NOTE — Progress Notes (Signed)
Patient was scheduled for televisit with Kandice Robinsons on 07/03/2020 at 4pm. I tried to call patient 4 times and was sent to VM each time. Left VM each time. Patient was No showed in the system and will need to reschedule.

## 2020-07-10 ENCOUNTER — Telehealth: Payer: Self-pay | Admitting: Internal Medicine

## 2020-07-10 ENCOUNTER — Ambulatory Visit (INDEPENDENT_AMBULATORY_CARE_PROVIDER_SITE_OTHER): Payer: BC Managed Care – PPO | Admitting: Podiatry

## 2020-07-10 ENCOUNTER — Encounter: Payer: Self-pay | Admitting: Podiatry

## 2020-07-10 ENCOUNTER — Other Ambulatory Visit: Payer: Self-pay

## 2020-07-10 DIAGNOSIS — L600 Ingrowing nail: Secondary | ICD-10-CM | POA: Diagnosis not present

## 2020-07-10 MED ORDER — NEOMYCIN-POLYMYXIN-HC 1 % OT SOLN
OTIC | 1 refills | Status: AC
Start: 2020-07-10 — End: ?

## 2020-07-10 NOTE — Telephone Encounter (Signed)
Please advise 

## 2020-07-10 NOTE — Progress Notes (Signed)
He presents today with a 1 week history of an ingrown toenail fibular border of the hallux right.  Objective: Vital signs are stable he is alert oriented x3.  Pulses are palpable.  Hallux interphalangeal is most likely resulting in juxtaposition and compression of the fibular border the hallux nail right against the second toe.  There is mild erythema sharp incurvated nail margin no purulence no malodor.  Assessment: Ingrown nail paronychia fibular border hallux right.  Plan: Chemical matricectomy was performed today after local anesthetic was administered.  Tolerated procedure well without complications.  I will follow-up with him in 2 weeks.  He was given both oral and written home-going structure for care and soaking of the toe as well as a prescription for Corticosporin otic applied twice daily after soaking.  Questions or concerns he will notify us immediately.

## 2020-07-10 NOTE — Telephone Encounter (Signed)
Pt called wanting a referral for a ingrown toenail his right toe is swollen

## 2020-07-10 NOTE — Telephone Encounter (Signed)
Not sure why im getting this message saw Dr.Hyatt today podiatry

## 2020-07-10 NOTE — Telephone Encounter (Signed)
Left message for Patient to return call if he is needing anything else

## 2020-07-10 NOTE — Patient Instructions (Signed)

## 2020-07-12 ENCOUNTER — Ambulatory Visit: Payer: BC Managed Care – PPO | Attending: Neurology

## 2020-07-12 DIAGNOSIS — G4710 Hypersomnia, unspecified: Secondary | ICD-10-CM | POA: Insufficient documentation

## 2020-07-12 DIAGNOSIS — G478 Other sleep disorders: Secondary | ICD-10-CM | POA: Diagnosis not present

## 2020-07-16 ENCOUNTER — Other Ambulatory Visit: Payer: Self-pay

## 2020-07-29 ENCOUNTER — Ambulatory Visit: Payer: BC Managed Care – PPO | Admitting: Podiatry

## 2020-08-05 DIAGNOSIS — G4733 Obstructive sleep apnea (adult) (pediatric): Secondary | ICD-10-CM | POA: Diagnosis not present

## 2020-08-09 ENCOUNTER — Telehealth: Payer: Self-pay

## 2020-08-09 NOTE — Telephone Encounter (Signed)
Sleep study has been reviewed by Dr. Vassie Loll. Dx hypersomnia and supper airway resistance syndrome.  Recommendations 1. Follow up with referring physician 2.clinical correlation is needed. 3.patient should try to avoid sleeping in supine position 4.Maintain lean body weight.  5.there is no indication of treatment of sleep disordered breathing. Most patients sleep parameters demonstrated in this study will not complain of excessive daytime sleepiness respresented by epworth sleepiness scale. Consequently, investigations of additional causes of daytime sleepiness may be appropriate.     Left message for patient to relay results.

## 2020-08-13 NOTE — Telephone Encounter (Signed)
Tried calling pt and there was no answer- LMTCB.  

## 2020-08-23 NOTE — Telephone Encounter (Signed)
LMTCB x 2 for results

## 2020-08-26 NOTE — Telephone Encounter (Signed)
ATC, LMTC x3.  Closing encounter as triage protocol after 3 attempts.

## 2022-03-11 ENCOUNTER — Telehealth: Payer: Self-pay | Admitting: Internal Medicine

## 2022-03-11 NOTE — Telephone Encounter (Signed)
Did he move to [place] a Dr. Lorella Nimrod is requesting his prior sleep studies  ?Is he ok with this?  ?If so fax please ? ? ?

## 2022-03-13 NOTE — Telephone Encounter (Signed)
Left message to return call. ? ?Okay to share results with below provider?  ?
